# Patient Record
Sex: Female | Born: 1965 | Race: White | Hispanic: No | Marital: Married | Smoking: Former smoker
Health system: Southern US, Community
[De-identification: ages and names within clinical notes are randomized; demographics above are authoritative.]

## PROBLEM LIST (undated history)

## (undated) DIAGNOSIS — N12 Tubulo-interstitial nephritis, not specified as acute or chronic: Secondary | ICD-10-CM

## (undated) DIAGNOSIS — N809 Endometriosis, unspecified: Secondary | ICD-10-CM

## (undated) DIAGNOSIS — IMO0002 Reserved for concepts with insufficient information to code with codable children: Secondary | ICD-10-CM

## (undated) DIAGNOSIS — R87619 Unspecified abnormal cytological findings in specimens from cervix uteri: Secondary | ICD-10-CM

## (undated) DIAGNOSIS — N83209 Unspecified ovarian cyst, unspecified side: Secondary | ICD-10-CM

## (undated) DIAGNOSIS — E041 Nontoxic single thyroid nodule: Secondary | ICD-10-CM

## (undated) DIAGNOSIS — R635 Abnormal weight gain: Secondary | ICD-10-CM

## (undated) DIAGNOSIS — Z87898 Personal history of other specified conditions: Secondary | ICD-10-CM

## (undated) HISTORY — DX: Tubulo-interstitial nephritis, not specified as acute or chronic: N12

## (undated) HISTORY — DX: Unspecified ovarian cyst, unspecified side: N83.209

## (undated) HISTORY — DX: Unspecified abnormal cytological findings in specimens from cervix uteri: R87.619

## (undated) HISTORY — DX: Personal history of other specified conditions: Z87.898

## (undated) HISTORY — DX: Nontoxic single thyroid nodule: E04.1

## (undated) HISTORY — DX: Endometriosis, unspecified: N80.9

## (undated) HISTORY — PX: WISDOM TOOTH EXTRACTION: SHX21

## (undated) HISTORY — DX: Reserved for concepts with insufficient information to code with codable children: IMO0002

## (undated) HISTORY — DX: Abnormal weight gain: R63.5

---

## 2012-10-20 ENCOUNTER — Encounter: Payer: Self-pay | Admitting: Emergency Medicine

## 2012-10-20 ENCOUNTER — Emergency Department (INDEPENDENT_AMBULATORY_CARE_PROVIDER_SITE_OTHER)
Admission: EM | Admit: 2012-10-20 | Discharge: 2012-10-20 | Disposition: A | Discharge status: Home / Self Care | Payer: 59 | Source: Home / Self Care | Attending: Family Medicine | Admitting: Family Medicine

## 2012-10-20 DIAGNOSIS — J0100 Acute maxillary sinusitis, unspecified: Secondary | ICD-10-CM

## 2012-10-20 MED ORDER — AMOXICILLIN 875 MG PO TABS: 875.0000 mg | ORAL_TABLET | Freq: Two times a day (BID) | ORAL | Status: DC

## 2012-10-20 NOTE — ED Provider Notes (Signed)
History     CSN: [id number]  Arrival date & time 10/20/12  1225   First MD Initiated Contact with Patient 10/20/12 1349      Chief Complaint  Patient presents with  . Facial Pain      HPI Comments: Patient complains of approximately 2 week history of gradually progressive URI symptoms beginning with a mild sore throat (now improved), followed by progressive nasal congestion.  A cough started next but is now improved.  Complains of fatigue and initial myalgias. There has been no pleuritic pain, shortness of breath, or wheezes.  Over the past four days she has developed pain behind the bridge of her nose and eyes, and has had nausea without vomiting.  She developed sweats last night.  The history is provided by the patient.    History reviewed. No pertinent past medical history.  History reviewed. No pertinent past surgical history.  Family History  Problem Relation Age of Onset  . Cancer Mother   . Hypertension Mother     History  Substance Use Topics  . Smoking status: Former Smoker    Quit date: 10/20/1992  . Smokeless tobacco: Not on file  . Alcohol Use: Yes    OB History    Grav Para Term Preterm Abortions TAB SAB Ect Mult Living                  Review of Systems + sore throat, resolved + cough, improved No pleuritic pain No wheezing + nasal congestion + post-nasal drainage + sinus pain/pressure No itchy/red eyes No earache                                                                                                                                                            No hemoptysis No SOB No fever, + chills/sweats last night + nausea No vomiting No abdominal pain No diarrhea No urinary symptoms No skin rashes + fatigue No myalgias + headache   Allergies  Review of patient's allergies indicates no known allergies.  Home Medications   Current Outpatient Rx  Name  Route  Sig  Dispense  Refill  . AMOXICILLIN 875 MG PO TABS   Oral  Take 1 tablet (875 mg total) by mouth 2 (two) times daily.   20 tablet   0     BP 99/65  Pulse 70  Temp 98.2 F (36.8 C) (Oral)  Resp 12  Ht 5\' 7"  (1.702 m)  Wt 152 lb (68.947 kg)  BMI 23.81 kg/m2  SpO2 97%  LMP 10/03/2012  Physical Exam Nursing notes and Vital Signs reviewed. Appearance:  Patient appears uncomfortable but otherwise healthy, stated age, and in no acute distress Eyes:  Pupils are equal, round, and reactive to light and accomodation.  Extraocular movement is intact.  Conjunctivae  are not inflamed  Ears:  Canals normal.  Tympanic membranes normal.  Nose:  Mildly congested turbinates.  Maxillary sinus tenderness is present.  Pharynx:  Normal Neck:  Supple.   Tender shotty posterior nodes are palpated bilaterally  Lungs:  Clear to auscultation.  Breath sounds are equal.  Heart:  Regular rate and rhythm without murmurs, rubs, or gallops.  Abdomen:  Nontender without masses or hepatosplenomegaly.  Bowel sounds are present.  No CVA or flank tenderness.  Extremities:  No edema.  No calf tenderness Skin:  No rash present.   ED Course  Procedures  none      1. Acute maxillary sinusitis       MDM  Begin amoxicillin. Take Mucinex D (guaifenesin with decongestant) twice daily for congestion.  Increase fluid intake, rest. May use Afrin nasal spray (or generic oxymetazoline) twice daily for about 5 days.  Also recommend using saline nasal spray several times daily and saline nasal irrigation (AYR is a common brand) Stop all antihistamines for now, and other non-prescription cough/cold preparations. Follow-up with family doctor if not improving 7 to 10 days.         Lattie Haw, MD 10/20/12 [phone]

## 2012-10-20 NOTE — ED Notes (Signed)
Sinus pain and pressure x 4 days, Nausea and dizziness x 1 day

## 2012-10-22 ENCOUNTER — Telehealth: Payer: Self-pay

## 2012-10-22 NOTE — ED Notes (Signed)
Left a message on voice mail asking how patient is feeling and advising to call back with any questions or concerns.  

## 2013-06-19 ENCOUNTER — Encounter: Payer: Self-pay | Admitting: Obstetrics & Gynecology

## 2013-06-19 ENCOUNTER — Ambulatory Visit (INDEPENDENT_AMBULATORY_CARE_PROVIDER_SITE_OTHER): Payer: 59 | Admitting: Obstetrics & Gynecology

## 2013-06-19 VITALS — BP 104/67 | HR 79 | Resp 16 | Ht 67.0 in | Wt 158.0 lb

## 2013-06-19 DIAGNOSIS — Z124 Encounter for screening for malignant neoplasm of cervix: Secondary | ICD-10-CM

## 2013-06-19 DIAGNOSIS — Z1151 Encounter for screening for human papillomavirus (HPV): Secondary | ICD-10-CM

## 2013-06-19 DIAGNOSIS — Z0000 Encounter for general adult medical examination without abnormal findings: Secondary | ICD-10-CM

## 2013-06-19 DIAGNOSIS — Z113 Encounter for screening for infections with a predominantly sexual mode of transmission: Secondary | ICD-10-CM

## 2013-06-19 DIAGNOSIS — Z01419 Encounter for gynecological examination (general) (routine) without abnormal findings: Secondary | ICD-10-CM

## 2013-06-19 NOTE — Progress Notes (Signed)
Subjective:    Victoria Hamilton is a 47 y.o. female who presents for an annual exam. She recently moved from [place] and is here today for an annual exam. She reports that for the last period she had red and then brown blood for 2 weeks before her regular period which lasted another 5 or 6 days of red bleeding. This is unusual for her. The patient is sexually active. GYN screening history: last pap: was normal. The patient wears seatbelts: yes. The patient participates in regular exercise: yes. (cardio) Has the patient ever been transfused or tattooed?: no. The patient reports that there is not domestic violence in her life.   Menstrual History: OB History   Grav Para Term Preterm Abortions TAB SAB Ect Mult Living   4 1   3  3   1       Menarche age: 66 [place]: 79 Patient's last menstrual period was 06/08/2013.    The following portions of the patient's history were reviewed and updated as appropriate: allergies, current medications, past family history, past medical history, past social history, past surgical history and problem list.  Review of Systems A comprehensive review of systems was negative. 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 She uses the rhythm method for contraception.   Objective:    BP 104/67  Pulse 79  Resp 16  Ht 5\' 7"  (1.702 m)  Wt 158 lb (71.668 kg)  BMI 24.74 kg/m2  LMP 06/08/2013  General Appearance:    Alert, cooperative, no distress, appears stated age  Head:    Normocephalic, without obvious abnormality, atraumatic  Eyes:    PERRL, conjunctiva/corneas clear, EOM's intact, fundi    benign, both eyes  Ears:    Normal TM's and external ear canals, both ears  Nose:   Nares normal, septum midline, mucosa normal, no drainage    or sinus tenderness  Throat:   Lips, mucosa, and tongue normal; teeth and gums normal   Neck:   Supple, symmetrical, trachea midline, no adenopathy;    thyroid:  no enlargement/tenderness/nodules; no carotid   bruit or JVD  Back:     Symmetric, no curvature, ROM normal, no CVA tenderness  Lungs:     Clear to auscultation bilaterally, respirations unlabored  Chest Wall:    No tenderness or deformity   Heart:    Regular rate and rhythm, S1 and S2 normal, no murmur, rub   or gallop  Breast Exam:    No tenderness, masses, or nipple abnormality  Abdomen:     Soft, non-tender, bowel sounds active all four quadrants,    no masses, no organomegaly  Genitalia:    Normal female without lesion, discharge or tenderness, NSSA, NT, mobile, normal adnexal exam     Extremities:   Extremities normal, atraumatic, no cyanosis or edema  Pulses:   2+ and symmetric all extremities  Skin:   Skin color, texture, turgor normal, no rashes or lesions  Lymph nodes:   Cervical, supraclavicular, and axillary nodes normal  Neurologic:   CNII-XII intact, normal strength, sensation and reflexes    throughout  .    Assessment:    Healthy female exam.    Plan:     Breast self exam technique reviewed and patient encouraged to perform self-exam monthly. Thin prep Pap smear. with cotesting and cultures (for eval of DUB) Check fasting labs in  the near future

## 2013-06-20 ENCOUNTER — Ambulatory Visit (HOSPITAL_BASED_OUTPATIENT_CLINIC_OR_DEPARTMENT_OTHER)
Admission: RE | Admit: 2013-06-20 | Discharge: 2013-06-20 | Disposition: A | Discharge status: Home / Self Care | Payer: 59 | Source: Ambulatory Visit | Attending: Obstetrics & Gynecology | Admitting: Obstetrics & Gynecology

## 2013-06-20 DIAGNOSIS — Z1231 Encounter for screening mammogram for malignant neoplasm of breast: Secondary | ICD-10-CM | POA: Insufficient documentation

## 2013-06-20 DIAGNOSIS — Z0000 Encounter for general adult medical examination without abnormal findings: Secondary | ICD-10-CM

## 2013-06-21 ENCOUNTER — Other Ambulatory Visit (INDEPENDENT_AMBULATORY_CARE_PROVIDER_SITE_OTHER): Payer: 59

## 2013-06-21 DIAGNOSIS — Z01419 Encounter for gynecological examination (general) (routine) without abnormal findings: Secondary | ICD-10-CM

## 2013-06-21 NOTE — Patient Instructions (Signed)
Thank you for enrolling in MyChart. Please follow the instructions below to securely access your online medical record. MyChart allows you to send messages to your doctor, view your test results, renew your prescriptions, schedule appointments, and more.  How Do I Sign Up? 1. In your Internet browser, go to [email or web address] 2. Click on the New  User? link in the Sign In box.  3. Enter your MyChart Access Code exactly as it appears below. You will not need to use this code after you have completed the sign-up process. If you do not sign up before the expiration date, you must request a new code. MyChart Access Code: [phone] Expires: 07/19/2013  3:50 PM  4. Enter the last four digits of your Social Security Number (xxxx) and Date of Birth (mm/dd/yyyy) as indicated and click Next. You will be taken to the next sign-up page. 5. Create a MyChart ID. This will be your MyChart login ID and cannot be changed, so think of one that is secure and easy to remember. 6. Create a MyChart password. You can change your password at any time. 7. Enter your Password Reset Question and Answer and click Next. This can be used at a later time if you forget your password.  8. Select your communication preference, and if applicable enter your e-mail address. You will receive e-mail notification when new information is available in MyChart by choosing to receive e-mail notifications and filling in your e-mail. 9. Click Sign In. You can now view your medical record.   Additional Information If you have questions, you can email [email or web address] or call [phone] to talk to our MyChart staff. Remember, MyChart is NOT to be used for urgent needs. For medical emergencies, dial 911.

## 2013-06-22 LAB — CBC
HCT: 42.1 % (ref 36.0–46.0)
MCV: 100.2 fL — ABNORMAL HIGH (ref 78.0–100.0)
RDW: 12.8 % (ref 11.5–15.5)
WBC: 6.2 10*3/uL (ref 4.0–10.5)

## 2013-06-22 LAB — COMPREHENSIVE METABOLIC PANEL
AST: 18 U/L (ref 0–37)
BUN: 18 mg/dL (ref 6–23)
Calcium: 9.1 mg/dL (ref 8.4–10.5)
Chloride: 104 mEq/L (ref 96–112)
Creat: 0.78 mg/dL (ref 0.50–1.10)
Glucose, Bld: 77 mg/dL (ref 70–99)

## 2013-06-22 LAB — LIPID PANEL
Cholesterol: 196 mg/dL (ref 0–200)
HDL: 67 mg/dL (ref >39)
Total CHOL/HDL Ratio: 2.9 Ratio
Triglycerides: 47 mg/dL (ref <150)

## 2013-06-26 ENCOUNTER — Telehealth: Payer: Self-pay | Admitting: *Deleted

## 2013-06-26 NOTE — Telephone Encounter (Signed)
Copy of labs mailed to pt and referral to FM to f/u on labs.

## 2013-06-26 NOTE — Telephone Encounter (Signed)
Message copied by Granville Lewis on Tue Jun 26, 2013  9:59 AM ------      Message from: Allie Bossier      Created: Tue Jun 26, 2013  9:57 AM       Can you please get her a referral to her FP/Int Med. Her LDL is elevated with a normal cholesterol. I'm not sure if they want to treat that.      Thanks ------

## 2014-03-02 ENCOUNTER — Emergency Department (INDEPENDENT_AMBULATORY_CARE_PROVIDER_SITE_OTHER)
Admission: EM | Admit: 2014-03-02 | Discharge: 2014-03-02 | Disposition: A | Discharge status: Home / Self Care | Payer: 59 | Source: Home / Self Care | Attending: Family Medicine | Admitting: Family Medicine

## 2014-03-02 ENCOUNTER — Encounter: Payer: Self-pay | Admitting: Emergency Medicine

## 2014-03-02 DIAGNOSIS — Z23 Encounter for immunization: Secondary | ICD-10-CM

## 2014-03-02 DIAGNOSIS — S61209A Unspecified open wound of unspecified finger without damage to nail, initial encounter: Secondary | ICD-10-CM

## 2014-03-02 DIAGNOSIS — IMO0001 Reserved for inherently not codable concepts without codable children: Secondary | ICD-10-CM

## 2014-03-02 MED ORDER — TETANUS-DIPHTH-ACELL PERTUSSIS 5-2.5-18.5 LF-MCG/0.5 IM SUSP
0.5000 mL | Freq: Once | INTRAMUSCULAR | Status: AC
  Administered 2014-03-02: 0.5 mL via INTRAMUSCULAR

## 2014-03-02 NOTE — ED Notes (Signed)
Pt was cutting on a roof and another person accidentally cut her R hand Index finger with a utility knife.  Happened less than an hour ago.  Pain 4/10.  Bleeding has stopped.  Last tetanus 2002-2005

## 2014-03-02 NOTE — ED Provider Notes (Addendum)
CSN: [id number]     Arrival date & time 03/02/14  1034 History   First MD Initiated Contact with Patient 03/02/14 1107     Chief Complaint  Patient presents with  . Laceration    R hand index finger      HPI Comments: Patient was cutting on a roof and another person accidentally cut her right hand Index finger with a utility knife less than an hour ago.  Bleeding has stopped.  Last tetanus 2002-2005  Patient is a 48 y.o. female presenting with skin laceration. The history is provided by the patient.  Laceration Location:  Finger Finger laceration location:  R index finger Length (cm):  1.5 Depth:  Through dermis Bleeding: controlled   Time since incident:  1 hour Laceration mechanism:  Knife Pain details:    Quality:  Aching   Severity:  Mild   Timing:  Constant   Progression:  Unchanged Foreign body present:  No foreign bodies Relieved by:  Nothing Worsened by:  Movement Tetanus status:  Out of date   Past Medical History  Diagnosis Date  . Endometriosis   . Ovarian cyst   . Abnormal Pap smear   . H/O abnormal mammogram    Past Surgical History  Procedure Laterality Date  . Cesarean section     Family History  Problem Relation Age of Onset  . Cancer Mother   . Hypertension Mother   . Diabetes Sister   . Heart disease Brother   . Hypertension Brother    History  Substance Use Topics  . Smoking status: Former Smoker -- 0.50 packs/day for 5 years    Types: Cigarettes    Quit date: 10/20/1992  . Smokeless tobacco: Never Used  . Alcohol Use: Yes   OB History   Grav Para Term Preterm Abortions TAB SAB Ect Mult Living   4 1   3  3   1      Review of Systems  All other systems reviewed and are negative.   Allergies  Bee venom  Home Medications   Prior to Admission medications   Medication Sig Start Date End Date Taking? Authorizing Provider  EPINEPHrine (EPI-PEN) 0.3 mg/0.3 mL SOAJ injection Inject into the muscle once.    Historical Provider, MD    BP 106/72  Pulse 75  Temp(Src) 98.2 F (36.8 C) (Oral)  Ht 5\' 7"  (1.702 m)  Wt 169 lb 12 oz (76.998 kg)  BMI 26.58 kg/m2  SpO2 99%  LMP 02/09/2014 Physical Exam  Nursing note and vitals reviewed. Constitutional: She is oriented to person, place, and time. She appears well-developed and well-nourished. No distress.  HENT:  Head: Atraumatic.  Eyes: Conjunctivae are normal. Pupils are equal, round, and reactive to light.  Musculoskeletal:       Right hand: She exhibits laceration. She exhibits normal range of motion, no tenderness, no bony tenderness, normal two-point discrimination, normal capillary refill, no deformity and no swelling. Normal sensation noted. Normal strength noted.       Hands: On the palmar surface of the right second finger, middle phalanx, is a simple superficial linear laceration as noted on diagram.    Neurological: She is alert and oriented to person, place, and time.  Skin: Skin is warm and dry.    ED Course  Procedures  Laceration Repair (Dermabond) Discussed benefits and risks of procedure and verbal consent obtained. Using sterile technique, cleansed wound with Betadine followed by copious lavage with normal saline.  Wound carefully inspected for  debris and foreign bodies; none found.  Wound edges carefully approximated in normal anatomic position and closed with Dermabond.  Wound precautions explained to patient.              MDM   1. Laceration of second finger, right    Tdap administered. Keep wound clean and dry.  Leave Dermabond in place as long as possible.  Return for any signs of infection.  Follow instructions on Dermabond instruction sheet.    Lattie HawStephen A Haifa Hatton, MD 03/06/14 [phone]  Lattie HawStephen A Cherika Jessie, MD 03/06/14 [phone]

## 2014-03-02 NOTE — Discharge Instructions (Signed)
Keep wound clean and dry.  Leave Dermabond in place as long as possible.  Return for any signs of infection.  Follow instructions on Dermabond instruction sheet.

## 2014-03-29 ENCOUNTER — Encounter: Payer: Self-pay | Admitting: Emergency Medicine

## 2014-03-29 ENCOUNTER — Emergency Department (INDEPENDENT_AMBULATORY_CARE_PROVIDER_SITE_OTHER)
Admission: EM | Admit: 2014-03-29 | Discharge: 2014-03-29 | Disposition: A | Discharge status: Home / Self Care | Payer: 59 | Source: Home / Self Care | Attending: Family Medicine | Admitting: Family Medicine

## 2014-03-29 DIAGNOSIS — T63441A Toxic effect of venom of bees, accidental (unintentional), initial encounter: Secondary | ICD-10-CM

## 2014-03-29 DIAGNOSIS — T6391XA Toxic effect of contact with unspecified venomous animal, accidental (unintentional), initial encounter: Secondary | ICD-10-CM

## 2014-03-29 MED ORDER — EPINEPHRINE 0.3 MG/0.3ML IJ SOAJ: 0.3000 mg | Freq: Once | INTRAMUSCULAR | Status: DC

## 2014-03-29 MED ORDER — TRIAMCINOLONE ACETONIDE 40 MG/ML IJ SUSP
40.0000 mg | Freq: Once | INTRAMUSCULAR | Status: AC
  Administered 2014-03-29: 40 mg via INTRAMUSCULAR

## 2014-03-29 NOTE — ED Notes (Signed)
Victoria Hamilton reports being sting by a bee in her right thigh about 15 minutes ago. 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 Currently, only complaint is her throat feels mildly tight. No SOB or CO, diaphoresis or swelling.

## 2014-03-29 NOTE — ED Provider Notes (Signed)
CSN: [id number]     Arrival date & time 03/29/14  1628 History   First MD Initiated Contact with Patient 03/29/14 1648     Chief Complaint  Patient presents with  . Insect Bite    bee sting- post epi injection      HPI Comments: Patient has a history of bee sting allergy.  Forty minutes ago she was stung on her right anterior thigh, and immediately used her Epipen.  The area is pruritic and redness has increased but she denies difficulty swallowing, shortness of breath, wheezing, hives, etc.  She requests a refill for her epipen.  The history is provided by the patient and the spouse.    Past Medical History  Diagnosis Date  . Endometriosis   . Ovarian cyst   . Abnormal Pap smear   . H/O abnormal mammogram    Past Surgical History  Procedure Laterality Date  . Cesarean section     Family History  Problem Relation Age of Onset  . Cancer Mother   . Hypertension Mother   . Diabetes Sister   . Heart disease Brother   . Hypertension Brother    History  Substance Use Topics  . Smoking status: Former Smoker -- 0.50 packs/day for 5 years    Types: Cigarettes    Quit date: 10/20/1992  . Smokeless tobacco: Never Used  . Alcohol Use: Yes   OB History   Grav Para Term Preterm Abortions TAB SAB Ect Mult Living   4 1   3  3   1      Review of Systems  Constitutional: Negative for diaphoresis, activity change and fatigue.  HENT: Negative for congestion and rhinorrhea.   Eyes: Negative for redness.  Respiratory: Negative for cough, choking, chest tightness, shortness of breath, wheezing and stridor.   Cardiovascular: Negative.   Gastrointestinal: Negative.   Genitourinary: Negative.   Neurological: Negative for dizziness, syncope and headaches.    Allergies  Bee venom  Home Medications   Prior to Admission medications   Medication Sig Start Date End Date Taking? Authorizing Provider  EPINEPHrine (EPI-PEN) 0.3 mg/0.3 mL SOAJ injection Inject into the muscle once.     Historical Provider, MD   BP 132/69  Pulse 111  Resp 16  SpO2 97%  LMP 02/09/2014 Physical Exam  Nursing note and vitals reviewed. Constitutional: She is oriented to person, place, and time. She appears well-developed and well-nourished. No distress.  HENT:  Head: Normocephalic and atraumatic.  Right Ear: External ear normal.  Left Ear: External ear normal.  Nose: Nose normal.  Mouth/Throat: Oropharynx is clear and moist.  Eyes: Conjunctivae are normal. Pupils are equal, round, and reactive to light.  Neck: Neck supple.  Cardiovascular: Normal rate and normal heart sounds.   Pulmonary/Chest: Breath sounds normal. No stridor. No respiratory distress. She has no wheezes. She has no rales.  Abdominal: There is no tenderness.  Musculoskeletal:       Legs: On patient's right anterior thigh as noted on diagram is an 8cm diameter erythematous macule, slightly warm to touch.  Lymphadenopathy:    She has no cervical adenopathy.  Neurological: She is alert and oriented to person, place, and time.  Skin: Skin is warm and dry. She is not diaphoretic.    ED Course  Procedures  none      MDM   1. Bee sting reaction    Kenalog 40mg  IM.  Rx for Epipen refill. Take Benadryl 25mg , 2 caps every 6 to 8  hours until skin reaction decreases. If symptoms become significantly worse during the night or over the weekend, proceed to the local emergency room. Return if site of sting becomes increasingly red, painful, or swollen over the next 2 to 3 days.    Lattie HawStephen A Beese, MD 04/02/14 [phone]

## 2014-03-29 NOTE — Discharge Instructions (Signed)
Take Benadryl $RemoveBefor'25mg'gmmylhYIdSOh$ , 2 caps every 6 to 8 hours until skin reaction decreases. If symptoms become significantly worse during the night or over the weekend, proceed to the local emergency room.    Anaphylactic Reaction An anaphylactic reaction is a sudden, severe allergic reaction that involves the whole body. It can be life threatening. A hospital stay is often required. People with asthma, eczema, or hay fever are slightly more likely to have an anaphylactic reaction. CAUSES  An anaphylactic reaction may be caused by anything to which you are allergic. After being exposed to the allergic substance, your immune system becomes sensitized to it. When you are exposed to that allergic substance again, an allergic reaction can occur. Common causes of an anaphylactic reaction include:  Medicines.  Foods, especially peanuts, wheat, shellfish, milk, and eggs.  Insect bites or stings.  Blood products.  Chemicals, such as dyes, latex, and contrast material used for imaging tests. SYMPTOMS  When an allergic reaction occurs, the body releases histamine and other substances. These substances cause symptoms such as tightening of the airway. Symptoms often develop within seconds or minutes of exposure. Symptoms may include:  Skin rash or hives.  Itching.  Chest tightness.  Swelling of the eyes, tongue, or lips.  Trouble breathing or swallowing.  Lightheadedness or fainting.  Anxiety or confusion.  Stomach pains, vomiting, or diarrhea.  Nasal congestion.  A fast or irregular heartbeat (palpitations). DIAGNOSIS  Diagnosis is based on your history of recent exposure to allergic substances, your symptoms, and a physical exam. Your caregiver may also perform blood or urine tests to confirm the diagnosis. TREATMENT  Epinephrine medicine is the main treatment for an anaphylactic reaction. Other medicines that may be used for treatment include antihistamines, steroids, and albuterol. In severe  cases, fluids and medicine to support blood pressure may be given through an intravenous line (IV). Even if you improve after treatment, you need to be observed to make sure your condition does not get worse. This may require a stay in the hospital. [place] a medical alert bracelet or necklace stating your allergy.  You and your family must learn how to use an anaphylaxis kit or give an epinephrine injection to temporarily treat an emergency allergic reaction. Always carry your epinephrine injection or anaphylaxis kit with you. This can be lifesaving if you have a severe reaction.  Do not drive or perform tasks after treatment until the medicines used to treat your reaction have worn off, or until your caregiver says it is okay.  If you have hives or a rash:  Take medicines as directed by your caregiver.  You may use an over-the-counter antihistamine (diphenhydramine) as needed.  Apply cold compresses to the skin or take baths in cool water. Avoid hot baths or showers. SEEK MEDICAL CARE IF:   You develop symptoms of an allergic reaction to a new substance. Symptoms may start right away or minutes later.  You develop a rash, hives, or itching.  You develop new symptoms. SEEK IMMEDIATE MEDICAL CARE IF:   You have swelling of the mouth, difficulty breathing, or wheezing.  You have a tight feeling in your chest or throat.  You develop hives, swelling, or itching all over your body.  You develop severe vomiting or diarrhea.  You feel faint or pass out. This is an emergency. Use your epinephrine injection or anaphylaxis kit as you have been instructed. Call your local emergency services (911 in [place] Even if you improve after  the injection, you need to be examined at a hospital emergency department. MAKE SURE YOU:   Understand these instructions.  Will watch your condition.  Will get help right away if you are not doing well or get worse. Document Released:  11/01/2005 Document Revised: 05/02/2012 Document Reviewed: 02/02/2012 [facility] Patient Information 2014 [place], [place].

## 2014-04-04 ENCOUNTER — Ambulatory Visit (INDEPENDENT_AMBULATORY_CARE_PROVIDER_SITE_OTHER): Payer: Self-pay | Admitting: Family Medicine

## 2014-04-04 ENCOUNTER — Encounter: Payer: Self-pay | Admitting: Family Medicine

## 2014-04-04 DIAGNOSIS — Z0289 Encounter for other administrative examinations: Secondary | ICD-10-CM

## 2014-04-04 NOTE — Progress Notes (Deleted)
CC: Victoria Hamilton is a 48 y.o. female is here for No chief complaint on file.   Subjective: HPI:  Going to [place] in September 2015 requesting counseling on receiving Hep A & B, Flu, MMR, Varicella vaccines and a TB test.   Review Of Systems Outlined In HPI  Past Medical History  Diagnosis Date  . Endometriosis   . Ovarian cyst   . Abnormal Pap smear   . H/O abnormal mammogram     Past Surgical History  Procedure Laterality Date  . Cesarean section     Family History  Problem Relation Age of Onset  . Cancer Mother   . Hypertension Mother   . Diabetes Sister   . Heart disease Brother   . Hypertension Brother     History   Social History  . Marital Status: Married    Spouse Name: N/A    Number of Children: N/A  . Years of Education: N/A   Occupational History  . accountant    Social History Main Topics  . Smoking status: Former Smoker -- 0.50 packs/day for 5 years    Types: Cigarettes    Quit date: 10/20/1992  . Smokeless tobacco: Never Used  . Alcohol Use: Yes  . Drug Use: Yes  . Sexual Activity: Yes    Partners: Male   Other Topics Concern  . Not on file   Social History Narrative  . No narrative on file     Objective: There were no vitals taken for this visit.  General: Alert and Oriented, No Acute Distress HEENT: Pupils equal, round, reactive to light. Conjunctivae clear.  External ears unremarkable, canals clear with intact TMs with appropriate landmarks.  Middle ear appears open without effusion. Pink inferior turbinates.  Moist mucous membranes, pharynx without inflammation nor lesions.  Neck supple without palpable lymphadenopathy nor abnormal masses. Lungs: Clear to auscultation bilaterally, no wheezing/ronchi/rales.  Comfortable work of breathing. Good air movement. Cardiac: Regular rate and rhythm. Normal S1/S2.  No murmurs, rubs, nor gallops.   Abdomen: Normal bowel sounds, soft and non tender without palpable masses. Extremities: No  peripheral edema.  Strong peripheral pulses.  Mental Status: No depression, anxiety, nor agitation. Skin: Warm and dry.  Assessment & Plan: There are no diagnoses linked to this encounter.  Tdap received 03/02/14 Hep A: Now and 2nd vaccine in 6-12 months Hep B: Now, 2nd vaccine 1 month after first dose, 3rd vaccine at least 2 months after 2nd dose and at least 4 months after 1st dose. MMR: Now and 2nd vaccine in at least 1 month. Varicella: Now and 2nd vaccine in 4-8 weeks. Influenza: Once vaccine is available in late summer. CDC recommends Typhoid vaccine (single IM dose).  CDC states that estimated relative risk of malaria for [place] travelers is Low.    No Follow-up on file.

## 2014-04-04 NOTE — Progress Notes (Signed)
Patient did not show within 10 minutes at her scheduled visit today. The following was Progress note for future reference.  Tdap received 03/02/14 Hep A: Now and 2nd vaccine in 6-12 months Hep B: Now, 2nd vaccine 1 month after first dose, 3rd vaccine at least 2 months after 2nd dose and at least 4 months after 1st dose. MMR: Now and 2nd vaccine in at least 1 month. Varicella: Now and 2nd vaccine in 4-8 weeks. Influenza: Once vaccine is available in late summer. CDC recommends Typhoid vaccine (single IM dose).  CDC states that estimated relative risk of malaria for [place] travelers is Low.

## 2014-04-04 NOTE — Progress Notes (Signed)
Patient has been deemed a "no-show" for today's scheduled appointment.  Based on chief complaint and my chart review: -No follow-up necessary.   If necessary this was communicated to the patient via phone or mail on: 1:10 PM 04/04/2014

## 2014-05-15 ENCOUNTER — Other Ambulatory Visit: Payer: Self-pay | Admitting: Obstetrics & Gynecology

## 2014-05-15 DIAGNOSIS — Z1231 Encounter for screening mammogram for malignant neoplasm of breast: Secondary | ICD-10-CM

## 2014-06-24 ENCOUNTER — Inpatient Hospital Stay (HOSPITAL_BASED_OUTPATIENT_CLINIC_OR_DEPARTMENT_OTHER): Admission: RE | Admit: 2014-06-24 | Payer: 59 | Source: Ambulatory Visit

## 2014-06-27 ENCOUNTER — Ambulatory Visit (INDEPENDENT_AMBULATORY_CARE_PROVIDER_SITE_OTHER): Payer: 59

## 2014-06-27 DIAGNOSIS — Z1231 Encounter for screening mammogram for malignant neoplasm of breast: Secondary | ICD-10-CM

## 2014-09-16 ENCOUNTER — Encounter: Payer: Self-pay | Admitting: Family Medicine

## 2015-01-03 ENCOUNTER — Emergency Department (INDEPENDENT_AMBULATORY_CARE_PROVIDER_SITE_OTHER)
Admission: EM | Admit: 2015-01-03 | Discharge: 2015-01-03 | Disposition: A | Discharge status: Home / Self Care | Payer: 59 | Source: Home / Self Care | Attending: Family Medicine | Admitting: Family Medicine

## 2015-01-03 ENCOUNTER — Encounter: Payer: Self-pay | Admitting: *Deleted

## 2015-01-03 DIAGNOSIS — H532 Diplopia: Secondary | ICD-10-CM

## 2015-01-03 LAB — POCT CBC W AUTO DIFF (K'VILLE URGENT CARE)

## 2015-01-03 LAB — POCT URINALYSIS DIPSTICK
BILIRUBIN UA: NEGATIVE
GLUCOSE UA: NEGATIVE
KETONES UA: NEGATIVE
LEUKOCYTES UA: NEGATIVE
NITRITE UA: NEGATIVE
PH UA: 6.5 (ref 5–8)
Protein, UA: NEGATIVE
Spec Grav, UA: 1.02 (ref 1.005–1.03)
Urobilinogen, UA: 0.2 (ref 0–1)

## 2015-01-03 NOTE — ED Provider Notes (Signed)
CSN: [id number]     Arrival date & time 01/03/15  1032 History   First MD Initiated Contact with Patient 01/03/15 1038     Chief Complaint  Patient presents with  . Blurred Vision     HPI Comments: Patient reports that she had a bitemporal headache two weeks ago that lasted about three days before resolving, and was different than her usual migraine headache that she gets one to 2 times per year. Yesterday at about 3:30pm while working on her computer she suddenly noted double vision.  She then rested and fell asleep, sleeping for about 16 hours.  Today she feels fatigued, but she no longer has double vision.  She does however note changes in near and far vision in each eye.  She recalls that yesterday she felt slightly dizzy with movement.  She has noticed generalized swelling for about two weeks that lasts throughout the day.  No other neurologic symptoms.  No sore throat or URI symptoms.  Patient's last menstrual period was 12/11/2014.   The history is provided by the patient and the spouse.    Past Medical History  Diagnosis Date  . Endometriosis   . Ovarian cyst   . Abnormal Pap smear   . H/O abnormal mammogram    Past Surgical History  Procedure Laterality Date  . Cesarean section     Family History  Problem Relation Age of Onset  . Cancer Mother   . Hypertension Mother   . Diabetes Sister   . Heart disease Brother   . Hypertension Brother    History  Substance Use Topics  . Smoking status: Former Smoker -- 0.50 packs/day for 5 years    Types: Cigarettes    Quit date: 10/20/1992  . Smokeless tobacco: Never Used  . Alcohol Use: Yes   OB History    Gravida Para Term Preterm AB TAB SAB Ectopic Multiple Living   Review of Systems No sore throat No cough No pleuritic pain No wheezing Mild increased nasal congestion No post-nasal drainage No sinus pain/pressure No itchy/red eyes No earache No hemoptysis No SOB No fever/chills +  nausea/dizziness No vomiting No abdominal pain No diarrhea No urinary symptoms No skin rash + fatigue No myalgias + headache    Allergies  Bee venom  Home Medications   Prior to Admission medications   Medication Sig Start Date End Date Taking? Authorizing Provider  EPINEPHrine (EPIPEN) 0.3 mg/0.3 mL IJ SOAJ injection Inject 0.3 mLs (0.3 mg total) into the muscle once. 03/29/14   Lattie Haw, MD   BP 106/73 mmHg  Pulse 76  Temp(Src) 98 F (36.7 C) (Oral)  Resp 14  Wt 169 lb (76.658 kg)  SpO2 99%  LMP 12/11/2014 Physical Exam Nursing notes and Vital Signs reviewed. Appearance:  Patient appears stated age, and in no acute distress Eyes:  Pupils are equal, round, and reactive to light and accomodation.  Extraocular movement is intact.  Conjunctivae are not inflamed.  Fundi benign.  Slight nystagmus on left lateral gaze. Ears:  Canals normal.  Tympanic membranes normal.  Nose:  Mildly congested turbinates.  No sinus tenderness.  Pharynx:  Normal Neck:  Supple.  Tender enlarged posterior nodes are palpated bilaterally  Lungs:  Clear to auscultation.  Breath sounds are equal.  Heart:  Regular rate and rhythm without murmurs, rubs, or gallops.  Abdomen:  Nontender without masses or hepatosplenomegaly.  Bowel sounds  are present.  No CVA or flank tenderness.  Extremities:  Trace edema lower legs.  No calf tenderness Skin:  No rash present.  Neurologic:  Cranial nerves 2 through 12 are normal.  Patellar, achilles, and elbow reflexes are normal.  Cerebellar function is intact (finger-to-nose and rapid alternating hand movement).  Gait and station are normal.     ED Course  Procedures  None    Labs Reviewed  COMPLETE METABOLIC PANEL WITH GFR  POCT CBC W AUTO DIFF ([facility]):  WBC 7.8; LY 29.3; MO 5.9; GR 64.8; Hgb 13.6; Platelets 313   POCT URINALYSIS DIPSTICK:   BLO trace lysed; otherwise negative         MDM   1. Diplopia; suspect prodromal viral symptoms.    CMP pending. Rest, ensure adequate fluid intake.  Check temperature daily.  May take meclizine if dizziness becomes worse.   Followup with Family Doctor if not improved in one week.     Lattie HawStephen A Amanii Snethen, MD 01/03/15 [phone]

## 2015-01-03 NOTE — ED Notes (Signed)
Bed: KUC5 Expected date:  Expected time:  Means of arrival:  Comments: 

## 2015-01-03 NOTE — Discharge Instructions (Signed)
Rest, ensure adequate fluid intake.  Check temperature daily.  May take meclizine if dizziness becomes worse.   Followup with Family Doctor if not improved in one week.     Diplopia  Double vision (diplopia) means that you are seeing two of everything. Diplopia usually occurs with both eyes open, and may be worse when looking in one particular direction. If both eyes must be open to see double, this is called binocular diplopia. If double images are seen in just one eye, this is called monocular diplopia.  CAUSES  Binocular Diplopia  Disorder affecting the muscles that move the eyes or the nerves that control those muscles.  Tumor or other mass pushing on an eye from beside or behind the eye.  Myasthenia gravis. This is a neuromuscular illness that causes the body's muscles to tire easily. The eye muscles and eyelid muscles become weak. The eyes do not track together well.  Grave's disease. This is an overactivity of the thyroid gland. This condition causes swelling of tissues around the eyes. This produces a bulging out of the eyeball.  Blowout fracture of the bone around the eye. The muscles of the eye socket are damaged. This often happens when the eye is hit with force.  Complications after certain surgeries, such as a lens implant after cataract surgery.  Fluid-filled mass (abscess) behind or beside the eye, infection, or abnormal connection between arteries and veins. Sometimes, no cause is found. Monocular Diplopia  Problems with corrective lens or contacts.  Corneal abrasion, infection, severe injury, or bulging and irregularity of the corneal surface (keratoconus).  Irregularities of the pupil from drugs, severe injury, or other causes.  Problems involving the lens of the eye, such as opacities or cataracts.  Complications after certain surgeries, such as a lens implant after cataract surgery.  Retinal detachment or problems involving the blood vessels of the  retina. Sometimes, no cause is found. SYMPTOMS  Binocular Diplopia  When one eye is closed or covered, the double images disappear. Monocular Diplopia  When the unaffected eye is closed or covered, the double images remain. The double images disappear when the affected eye is closed or covered. DIAGNOSIS  A diagnosis is made during an eye exam. TREATMENT  Treatment depends on the cause or underlying disease.   Relief of double vision symptoms may be achieved by patching one eye or by using special glasses.  Surgery on the muscles of the eye may be needed. SEEK IMMEDIATE MEDICAL CARE IF:   You see two images of a single object you are looking at, either with both eyes open or with just one eye open. Document Released: 09/02/2004 Document Revised: 01/24/2012 Document Reviewed: 06/19/2008 [facility] Patient Information 2015 [place], [place]. This information is not intended to replace advice given to you by your health care provider. Make sure you discuss any questions you have with your health care provider.

## 2015-01-03 NOTE — ED Notes (Signed)
Victoria Hamilton c/o double vision , HA and dizziness yesterday. Today her vision is blurred. She slept for the past 16 hours. Reports she feels "swollen". H/O migraines, no HA today, no h/o DM.

## 2015-01-04 LAB — COMPLETE METABOLIC PANEL WITH GFR
ALBUMIN: 4.1 g/dL (ref 3.5–5.2)
ALK PHOS: 67 U/L (ref 39–117)
ALT: 13 U/L (ref 0–35)
AST: 14 U/L (ref 0–37)
BILIRUBIN TOTAL: 1.4 mg/dL — AB (ref 0.2–1.2)
BUN: 18 mg/dL (ref 6–23)
CO2: 28 mEq/L (ref 19–32)
CREATININE: 0.62 mg/dL (ref 0.50–1.10)
Calcium: 9.2 mg/dL (ref 8.4–10.5)
Chloride: 103 mEq/L (ref 96–112)
GFR, Est African American: >89 mL/min
GLUCOSE: 92 mg/dL (ref 70–99)
Potassium: 4.5 mEq/L (ref 3.5–5.3)
Sodium: 138 mEq/L (ref 135–145)
Total Protein: 6.5 g/dL (ref 6.0–8.3)

## 2015-01-05 ENCOUNTER — Telehealth: Payer: Self-pay | Admitting: Emergency Medicine

## 2015-01-15 ENCOUNTER — Ambulatory Visit: Payer: 59 | Admitting: Sports Medicine

## 2015-02-04 ENCOUNTER — Ambulatory Visit (INDEPENDENT_AMBULATORY_CARE_PROVIDER_SITE_OTHER): Payer: 59 | Admitting: Physician Assistant

## 2015-02-04 ENCOUNTER — Encounter: Payer: Self-pay | Admitting: Physician Assistant

## 2015-02-04 VITALS — BP 104/69 | HR 76 | Wt 172.0 lb

## 2015-02-04 DIAGNOSIS — R5383 Other fatigue: Secondary | ICD-10-CM | POA: Diagnosis not present

## 2015-02-04 DIAGNOSIS — E049 Nontoxic goiter, unspecified: Secondary | ICD-10-CM | POA: Diagnosis not present

## 2015-02-04 DIAGNOSIS — R0789 Other chest pain: Secondary | ICD-10-CM

## 2015-02-04 DIAGNOSIS — R635 Abnormal weight gain: Secondary | ICD-10-CM | POA: Diagnosis not present

## 2015-02-04 DIAGNOSIS — Z0000 Encounter for general adult medical examination without abnormal findings: Secondary | ICD-10-CM | POA: Insufficient documentation

## 2015-02-04 NOTE — Progress Notes (Addendum)
Subjective:    Patient ID: Victoria Hamilton, female    DOB: 1966-06-13, 49 y.o.   MRN: [id number]  HPI Patient is a 49 year old female who presents to the clinic to establish care.  .. Active Ambulatory Problems    Diagnosis Date Noted  . Well adult exam 02/04/2015   Resolved Ambulatory Problems    Diagnosis Date Noted  . No Resolved Ambulatory Problems   Past Medical History  Diagnosis Date  . Endometriosis   . Ovarian cyst   . Abnormal Pap smear   . H/O abnormal mammogram    .. Family History  Problem Relation Age of Onset  . Cancer Mother   . Hypertension Mother   . Diabetes Sister   . Heart disease Brother   . Hypertension Brother   . Heart attack Maternal Uncle 52  . Heart attack Paternal Grandfather 50   .Marland Kitchen. History   Social History  . Marital Status: Married    Spouse Name: N/A  . Number of Children: N/A  . Years of Education: N/A   Occupational History  . accountant    Social History Main Topics  . Smoking status: Former Smoker -- 0.50 packs/day for 5 years    Types: Cigarettes    Quit date: 10/20/1992  . Smokeless tobacco: Never Used  . Alcohol Use: Yes  . Drug Use: Yes  . Sexual Activity:    Partners: Male   Other Topics Concern  . Not on file   Social History Narrative   Patient ultimately has not been feeling well for the past couple of months. She has had very unspecific symptoms. She's had a lot of pressure in chest off and on. She is extremely fatigued. She had episode of double vision and was seen in urgent care in the last month. It did resolve the next couple of days and they thought was viral. She has some off and on shortness of breath. She has some off and on trouble swallowing. She has headaches more frequently. Her weight over the past 2 years has increased by 20 pounds. She is currently on no medications. Her last annual physical was with Dr. Marice Potterove in 2014.   Review of Systems  All other systems reviewed and are negative.       Objective:   Physical Exam  Constitutional: She is oriented to person, place, and time. She appears well-developed and well-nourished.  HENT:  Head: Normocephalic and atraumatic.  Right Ear: External ear normal.  Left Ear: External ear normal.  Nose: Nose normal.  Mouth/Throat: Oropharynx is clear and moist. No oropharyngeal exudate.  Eyes: Conjunctivae are normal. Right eye exhibits no discharge. Left eye exhibits no discharge.  Neck: Normal range of motion. Neck supple.  Digesting to be some bilateral thyroid enlargement to palpation. No tenderness.  Cardiovascular: Normal rate, regular rhythm and normal heart sounds.   Pulmonary/Chest: Effort normal and breath sounds normal. She has no wheezes.  Neurological: She is alert and oriented to person, place, and time.  Skin: Skin is dry.  Psychiatric: She has a normal mood and affect. Her behavior is normal.          Assessment & Plan:  Weight gain/fatigue/chest tightness/shortness of breath- unclear etiology of symptoms today. PHQ-2 was 0 and negative for depression. will check a fairly expansive fatigue panel with TSH, CBC, ferritin, B12, vitamin D, ANA, EBV, lymes disease. Patient admits to sleeping at night and getting a full 8 hours of sleep. She does not wake up  feeling rested. Will also get lipid panel.   EKG was done today. Normal sinus rhythm at 68. No ST elevation or depression. EKG was unremarkable.  Thyroid enlargement-ordered thyroid ultrasound for first thing in the morning.  Patient does have a annual physical with Dr. Marice Potter next week.

## 2015-02-04 NOTE — Patient Instructions (Addendum)
Tomorrow at 8:45 U/S  Fatigue Fatigue is a feeling of tiredness, lack of energy, lack of motivation, or feeling tired all the time. Having enough rest, good nutrition, and reducing stress will normally reduce fatigue. Consult your caregiver if it persists. The nature of your fatigue will help your caregiver to find out its cause. The treatment is based on the cause.  CAUSES  There are many causes for fatigue. Most of the time, fatigue can be traced to one or more of your habits or routines. Most causes fit into one or more of three general areas. They are: Lifestyle problems  Sleep disturbances.  Overwork.  Physical exertion.  Unhealthy habits.  Poor eating habits or eating disorders.  Alcohol and/or drug use .  Lack of proper nutrition (malnutrition). Psychological problems  Stress and/or anxiety problems.  Depression.  Grief.  Boredom. Medical Problems or Conditions  Anemia.  Pregnancy.  Thyroid gland problems.  Recovery from major surgery.  Continuous pain.  Emphysema or asthma that is not well controlled  Allergic conditions.  Diabetes.  Infections (such as mononucleosis).  Obesity.  Sleep disorders, such as sleep apnea.  Heart failure or other heart-related problems.  Cancer.  Kidney disease.  Liver disease.  Effects of certain medicines such as antihistamines, cough and cold remedies, prescription pain medicines, heart and blood pressure medicines, drugs used for treatment of cancer, and some antidepressants. SYMPTOMS  The symptoms of fatigue include:   Lack of energy.  Lack of drive (motivation).  Drowsiness.  Feeling of indifference to the surroundings. DIAGNOSIS  The details of how you feel help guide your caregiver in finding out what is causing the fatigue. You will be asked about your present and past health condition. It is important to review all medicines that you take, including prescription and non-prescription items. A  thorough exam will be done. You will be questioned about your feelings, habits, and normal lifestyle. Your caregiver may suggest blood tests, urine tests, or other tests to look for common medical causes of fatigue.  TREATMENT  Fatigue is treated by correcting the underlying cause. For example, if you have continuous pain or depression, treating these causes will improve how you feel. Similarly, adjusting the dose of certain medicines will help in reducing fatigue.  HOME CARE INSTRUCTIONS   Try to get the required amount of good sleep every night.  Eat a healthy and nutritious diet, and drink enough water throughout the day.  Practice ways of relaxing (including yoga or meditation).  Exercise regularly.  Make plans to change situations that cause stress. Act on those plans so that stresses decrease over time. Keep your work and personal routine reasonable.  Avoid street drugs and minimize use of alcohol.  Start taking a daily multivitamin after consulting your caregiver. SEEK MEDICAL CARE IF:   You have persistent tiredness, which cannot be accounted for.  You have fever.  You have unintentional weight loss.  You have headaches.  You have disturbed sleep throughout the night.  You are feeling sad.  You have constipation.  You have dry skin.  You have gained weight.  You are taking any new or different medicines that you suspect are causing fatigue.  You are unable to sleep at night.  You develop any unusual swelling of your legs or other parts of your body. SEEK IMMEDIATE MEDICAL CARE IF:   You are feeling confused.  Your vision is blurred.  You feel faint or pass out.  You develop severe headache.  You  develop severe abdominal, pelvic, or back pain.  You develop chest pain, shortness of breath, or an irregular or fast heartbeat.  You are unable to pass a normal amount of urine.  You develop abnormal bleeding such as bleeding from the rectum or you vomit  blood.  You have thoughts about harming yourself or committing suicide.  You are worried that you might harm someone else. MAKE SURE YOU:   Understand these instructions.  Will watch your condition.  Will get help right away if you are not doing well or get worse. Document Released: 08/29/2007 Document Revised: 01/24/2012 Document Reviewed: 03/05/2014 [facility] Patient Information 2015 [place], [place]. This information is not intended to replace advice given to you by your health care provider. Make sure you discuss any questions you have with your health care provider.

## 2015-02-05 ENCOUNTER — Other Ambulatory Visit: Payer: Self-pay | Admitting: Physician Assistant

## 2015-02-05 ENCOUNTER — Ambulatory Visit (INDEPENDENT_AMBULATORY_CARE_PROVIDER_SITE_OTHER): Payer: 59

## 2015-02-05 ENCOUNTER — Encounter: Payer: Self-pay | Admitting: Physician Assistant

## 2015-02-05 DIAGNOSIS — E010 Iodine-deficiency related diffuse (endemic) goiter: Secondary | ICD-10-CM | POA: Diagnosis not present

## 2015-02-05 DIAGNOSIS — E049 Nontoxic goiter, unspecified: Secondary | ICD-10-CM

## 2015-02-05 DIAGNOSIS — E041 Nontoxic single thyroid nodule: Secondary | ICD-10-CM

## 2015-02-05 DIAGNOSIS — R635 Abnormal weight gain: Secondary | ICD-10-CM

## 2015-02-05 DIAGNOSIS — E042 Nontoxic multinodular goiter: Secondary | ICD-10-CM

## 2015-02-05 DIAGNOSIS — R5383 Other fatigue: Secondary | ICD-10-CM

## 2015-02-05 LAB — LIPID PANEL
Cholesterol: 201 mg/dL — ABNORMAL HIGH (ref 0–200)
HDL: 64 mg/dL (ref >=46)
LDL CALC: 128 mg/dL — AB (ref 0–99)
TRIGLYCERIDES: 47 mg/dL (ref <150)
Total CHOL/HDL Ratio: 3.1 Ratio
VLDL: 9 mg/dL (ref 0–40)

## 2015-02-05 LAB — COMPLETE METABOLIC PANEL WITHOUT GFR
ALT: 16 U/L (ref 0–35)
AST: 16 U/L (ref 0–37)
Albumin: 4.2 g/dL (ref 3.5–5.2)
Alkaline Phosphatase: 58 U/L (ref 39–117)
BUN: 19 mg/dL (ref 6–23)
CO2: 26 meq/L (ref 19–32)
Calcium: 9.2 mg/dL (ref 8.4–10.5)
Chloride: 105 meq/L (ref 96–112)
Creat: 0.66 mg/dL (ref 0.50–1.10)
GFR, Est African American: >89 mL/min
GFR, Est Non African American: >89 mL/min
Glucose, Bld: 94 mg/dL (ref 70–99)
Potassium: 4.2 meq/L (ref 3.5–5.3)
Sodium: 139 meq/L (ref 135–145)
Total Bilirubin: 0.7 mg/dL (ref 0.2–1.2)
Total Protein: 6.7 g/dL (ref 6.0–8.3)

## 2015-02-05 LAB — FERRITIN: FERRITIN: 29 ng/mL (ref 10–291)

## 2015-02-05 LAB — TSH: TSH: 0.869 u[IU]/mL (ref 0.350–4.500)

## 2015-02-05 LAB — VITAMIN B12: VITAMIN B 12: 701 pg/mL (ref 211–911)

## 2015-02-06 LAB — EPSTEIN-BARR VIRUS VCA ANTIBODY PANEL
EBV EA IgG: <5 U/mL (ref <9.0)
EBV NA IGG: 475 U/mL — AB (ref <18.0)
EBV VCA IGG: 627 U/mL — AB (ref <18.0)
EBV VCA IgM: <10 U/mL (ref <36.0)

## 2015-02-06 LAB — VITAMIN D 25 HYDROXY (VIT D DEFICIENCY, FRACTURES): Vit D, 25-Hydroxy: 29 ng/mL — ABNORMAL LOW (ref 30–100)

## 2015-02-06 LAB — B. BURGDORFI ANTIBODIES BY WB
B BURGDORFERI IGG ABS (IB): NEGATIVE
B BURGDORFERI IGM ABS (IB): NEGATIVE

## 2015-02-06 LAB — ANA: ANA: NEGATIVE

## 2015-02-11 ENCOUNTER — Other Ambulatory Visit (HOSPITAL_COMMUNITY)
Admission: RE | Admit: 2015-02-11 | Discharge: 2015-02-11 | Disposition: A | Discharge status: Home / Self Care | Payer: 59 | Source: Ambulatory Visit | Attending: Interventional Radiology | Admitting: Interventional Radiology

## 2015-02-11 ENCOUNTER — Ambulatory Visit
Admission: RE | Admit: 2015-02-11 | Discharge: 2015-02-11 | Disposition: A | Discharge status: Home / Self Care | Payer: 59 | Source: Ambulatory Visit | Attending: Physician Assistant | Admitting: Physician Assistant

## 2015-02-11 DIAGNOSIS — E041 Nontoxic single thyroid nodule: Secondary | ICD-10-CM | POA: Insufficient documentation

## 2015-02-12 ENCOUNTER — Ambulatory Visit (INDEPENDENT_AMBULATORY_CARE_PROVIDER_SITE_OTHER): Payer: 59 | Admitting: Obstetrics & Gynecology

## 2015-02-12 ENCOUNTER — Encounter: Payer: Self-pay | Admitting: Obstetrics & Gynecology

## 2015-02-12 VITALS — BP 92/56 | HR 67 | Resp 16 | Ht 67.0 in | Wt 174.0 lb

## 2015-02-12 DIAGNOSIS — Z113 Encounter for screening for infections with a predominantly sexual mode of transmission: Secondary | ICD-10-CM

## 2015-02-12 DIAGNOSIS — Z124 Encounter for screening for malignant neoplasm of cervix: Secondary | ICD-10-CM | POA: Diagnosis not present

## 2015-02-12 DIAGNOSIS — R102 Pelvic and perineal pain: Secondary | ICD-10-CM | POA: Diagnosis not present

## 2015-02-12 DIAGNOSIS — Z0000 Encounter for general adult medical examination without abnormal findings: Secondary | ICD-10-CM

## 2015-02-12 DIAGNOSIS — Z1151 Encounter for screening for human papillomavirus (HPV): Secondary | ICD-10-CM | POA: Diagnosis not present

## 2015-02-12 DIAGNOSIS — Z01419 Encounter for gynecological examination (general) (routine) without abnormal findings: Secondary | ICD-10-CM

## 2015-02-12 MED ORDER — VITAMIN D (ERGOCALCIFEROL) 1.25 MG (50000 UNIT) PO CAPS: 50000.0000 [IU] | ORAL_CAPSULE | ORAL | Status: DC

## 2015-02-12 NOTE — Progress Notes (Signed)
Subjective:    Victoria Hamilton is a 49 y.o. MW 53P1 27(47 yo son) female who presents for an annual exam. The patient has no complaints today except for pain with sex and she will have a bloody discharge after sex since 2014. The patient is sexually active. GYN screening history: last pap: was normal. The patient wears seatbelts: yes. The patient participates in regular exercise: yes. Has the patient ever been transfused or tattooed?: no. The patient reports that there is not domestic violence in her life.   Menstrual History: OB History    Gravida Para Term Preterm AB TAB SAB Ectopic Multiple Living   4 1   3  3   1       Menarche age: [age]  Patient's last menstrual period was 02/07/2015.    The following portions of the patient's history were reviewed and updated as appropriate: allergies, current medications, past family history, past medical history, past social history, past surgical history and problem list.  Review of Systems A comprehensive review of systems was negative.  She has always used the rhythm method for contraception. She is a Estate manager/land agentfinance manager. She declines a flu vaccine. She has worsening chronic constipation.   Objective:    BP 92/56 mmHg  Pulse 67  Resp 16  Ht 5\' 7"  (1.702 m)  Wt 174 lb (78.926 kg)  BMI 27.25 kg/m2  LMP 02/07/2015  General Appearance:    Alert, cooperative, no distress, appears stated age  Head:    Normocephalic, without obvious abnormality, atraumatic  Eyes:    PERRL, conjunctiva/corneas clear, EOM's intact, fundi    benign, both eyes  Ears:    Normal TM's and external ear canals, both ears  Nose:   Nares normal, septum midline, mucosa normal, no drainage    or sinus tenderness  Throat:   Lips, mucosa, and tongue normal; teeth and gums normal  Neck:   Supple, symmetrical, trachea midline, no adenopathy;    thyroid:  no enlargement/tenderness/nodules; no carotid   bruit or JVD  Back:     Symmetric, no curvature, ROM normal, no CVA tenderness   Lungs:     Clear to auscultation bilaterally, respirations unlabored  Chest Wall:    No tenderness or deformity   Heart:    Regular rate and rhythm, S1 and S2 normal, no murmur, rub   or gallop  Breast Exam:    No tenderness, masses, or nipple abnormality  Abdomen:     Soft, non-tender, bowel sounds active all four quadrants,    no masses, no organomegaly  Genitalia:    Normal female without lesion, discharge or tenderness, NSSA, NT, mobile, No adnexal masses but I could feel a large amount of squishable material in her LLQ (presumably stool)     Extremities:   Extremities normal, atraumatic, no cyanosis or edema  Pulses:   2+ and symmetric all extremities  Skin:   Skin color, texture, turgor normal, no rashes or lesions  Lymph nodes:   Cervical, supraclavicular, and axillary nodes normal  Neurologic:   CNII-XII intact, normal strength, sensation and reflexes    throughout  .    Assessment:    Healthy female exam.   Low vitamin D LLQ pain Chronic constipation   Plan:     Breast self exam technique reviewed and patient encouraged to perform self-exam monthly. Chlamydia specimen. GC specimen. Thin prep Pap smear. add Vitamin D   Gyn u/s Rec Miralax

## 2015-02-13 LAB — CYTOLOGY - PAP

## 2015-02-14 ENCOUNTER — Ambulatory Visit (INDEPENDENT_AMBULATORY_CARE_PROVIDER_SITE_OTHER): Payer: 59

## 2015-02-14 DIAGNOSIS — N941 Dyspareunia: Secondary | ICD-10-CM

## 2015-02-14 DIAGNOSIS — R102 Pelvic and perineal pain: Secondary | ICD-10-CM

## 2015-02-14 DIAGNOSIS — R1032 Left lower quadrant pain: Secondary | ICD-10-CM | POA: Diagnosis not present

## 2015-02-17 ENCOUNTER — Telehealth: Payer: Self-pay | Admitting: *Deleted

## 2015-02-17 NOTE — Telephone Encounter (Signed)
Pt notified of normal Pap smear and pelvic U/S.  She states that she has been using the Miralax and feeling better.

## 2015-02-26 ENCOUNTER — Telehealth: Payer: Self-pay | Admitting: *Deleted

## 2015-02-26 NOTE — Telephone Encounter (Signed)
Patient called here in office a few weeks ago. Stated lab work results were   low iron  & low vitamin D. Has been taking vitamin -d but is feeling  Worse. Wonders if she should come back in or get referral to a Endocrinologist. Because something is just not right.

## 2015-02-26 NOTE — Telephone Encounter (Signed)
Please print off all labs and last note. We can send to endocrinology. Did you get biopsy report back? i did not have results?   We have done fairly complete work up for fatigue.   Ok for endocrinology referral for fatigue.

## 2015-02-27 ENCOUNTER — Telehealth: Payer: Self-pay | Admitting: *Deleted

## 2015-02-27 DIAGNOSIS — R5382 Chronic fatigue, unspecified: Secondary | ICD-10-CM

## 2015-02-27 NOTE — Telephone Encounter (Signed)
Referral placed.

## 2015-02-27 NOTE — Telephone Encounter (Signed)
Referral placed.  Pt notified.  [facility]

## 2015-04-10 ENCOUNTER — Encounter: Payer: Self-pay | Admitting: Family Medicine

## 2015-04-10 ENCOUNTER — Ambulatory Visit (INDEPENDENT_AMBULATORY_CARE_PROVIDER_SITE_OTHER): Payer: 59

## 2015-04-10 ENCOUNTER — Ambulatory Visit (INDEPENDENT_AMBULATORY_CARE_PROVIDER_SITE_OTHER): Payer: 59 | Admitting: Family Medicine

## 2015-04-10 VITALS — BP 154/84 | HR 85 | Wt 174.0 lb

## 2015-04-10 DIAGNOSIS — E559 Vitamin D deficiency, unspecified: Secondary | ICD-10-CM | POA: Diagnosis not present

## 2015-04-10 DIAGNOSIS — R0789 Other chest pain: Secondary | ICD-10-CM | POA: Diagnosis not present

## 2015-04-10 DIAGNOSIS — R591 Generalized enlarged lymph nodes: Secondary | ICD-10-CM

## 2015-04-10 DIAGNOSIS — H538 Other visual disturbances: Secondary | ICD-10-CM

## 2015-04-10 DIAGNOSIS — R079 Chest pain, unspecified: Secondary | ICD-10-CM

## 2015-04-10 DIAGNOSIS — R5383 Other fatigue: Secondary | ICD-10-CM

## 2015-04-10 DIAGNOSIS — S46812A Strain of other muscles, fascia and tendons at shoulder and upper arm level, left arm, initial encounter: Secondary | ICD-10-CM

## 2015-04-10 NOTE — Progress Notes (Signed)
   Subjective:    Patient ID: Victoria Hamilton, female    DOB: 03/21/1966, 49 y.o.   MRN: [id number]  HPI  follow-up fatigue- vitamin D was low 2 months ago. Vitamin B12 was great. Her ferritin, iron stores wer low. She was encouraged to increase iron rich foods in her diet and consider a daily supplement. She has been taking this but says she really doesn't feel any better. She screen neg for depression. Has been sleeping well.   She also had complained of some chest pressure back in March.  Still having that pressure.    She was having some pain  Around her left shoulder and chest and felt a pea sized lump that was very hard and firm.  Went away after a couple of days.  Still feels sore. Most of her pain has been on her left side. Last mammo was normal.    Pain over the Left trapezius - Has been trying to do yoga as well. Did get a massage as well that has helped.    Review of Systems  + double vision. Intermittant.      Objective:   Physical Exam  Constitutional: She is oriented to person, place, and time. She appears well-developed and well-nourished.  HENT:  Head: Normocephalic and atraumatic.  Right Ear: External ear normal.  Left Ear: External ear normal.  Nose: Nose normal.  Mouth/Throat: Oropharynx is clear and moist.  TMs and canals are clear.   Eyes: Conjunctivae and EOM are normal. Pupils are equal, round, and reactive to light.  Neck: Neck supple. No thyromegaly present.  Cardiovascular: Normal rate, regular rhythm and normal heart sounds.   Pulmonary/Chest: Effort normal and breath sounds normal. She has no wheezes. Right breast exhibits no inverted nipple, no mass, no nipple discharge, no skin change and no tenderness. Left breast exhibits no inverted nipple, no mass, no nipple discharge, no skin change and no tenderness.  Small shotty LN at the upper outer qad of the left breast towards the axilla.   Lymphadenopathy:    She has no cervical adenopathy.  Neurological: She  is alert and oriented to person, place, and time.  Skin: Skin is warm and dry.  Psychiatric: She has a normal mood and affect.          Assessment & Plan:   fatigue- unclear etiology. Good w/ u that is neg so far.  Vit D def- due to recheck levels.    atypical chest pain- EKG shows normal sinus rhythm with normal axis and no acute sT-T wave changes. Will get CXR.   Left trapezius train - massage helps. Consider PT.   Lump in axilla - suspect with either a skin cyst or a LN.  Resolved on its own. Will order US to make sure not abnormality of LN in the breast tissue. mammo is UTD.   Blurry vision - recommend she schedule appt with her eye doc.

## 2015-04-10 NOTE — Patient Instructions (Signed)
Dr. Jackquline BoschAmy Harper -optometrist.

## 2015-04-13 ENCOUNTER — Encounter: Payer: Self-pay | Admitting: Family Medicine

## 2015-04-13 DIAGNOSIS — R5383 Other fatigue: Secondary | ICD-10-CM | POA: Insufficient documentation

## 2015-04-13 DIAGNOSIS — R0789 Other chest pain: Secondary | ICD-10-CM | POA: Insufficient documentation

## 2015-07-08 ENCOUNTER — Other Ambulatory Visit: Payer: Self-pay

## 2015-07-08 DIAGNOSIS — R591 Generalized enlarged lymph nodes: Secondary | ICD-10-CM

## 2015-07-15 ENCOUNTER — Other Ambulatory Visit: Payer: Self-pay | Admitting: Family Medicine

## 2015-07-15 DIAGNOSIS — R591 Generalized enlarged lymph nodes: Secondary | ICD-10-CM

## 2015-07-22 ENCOUNTER — Ambulatory Visit
Admission: RE | Admit: 2015-07-22 | Discharge: 2015-07-22 | Disposition: A | Discharge status: Home / Self Care | Payer: 59 | Source: Ambulatory Visit | Attending: Family Medicine | Admitting: Family Medicine

## 2015-07-22 DIAGNOSIS — R591 Generalized enlarged lymph nodes: Secondary | ICD-10-CM

## 2016-06-29 ENCOUNTER — Encounter: Payer: Self-pay | Admitting: Obstetrics & Gynecology

## 2016-06-29 ENCOUNTER — Ambulatory Visit (INDEPENDENT_AMBULATORY_CARE_PROVIDER_SITE_OTHER): Payer: 59 | Admitting: Obstetrics & Gynecology

## 2016-06-29 VITALS — BP 95/65 | HR 78 | Resp 16 | Ht 67.0 in | Wt 183.0 lb

## 2016-06-29 DIAGNOSIS — K5900 Constipation, unspecified: Secondary | ICD-10-CM | POA: Diagnosis not present

## 2016-06-29 DIAGNOSIS — R635 Abnormal weight gain: Secondary | ICD-10-CM

## 2016-06-29 DIAGNOSIS — Z1151 Encounter for screening for human papillomavirus (HPV): Secondary | ICD-10-CM | POA: Diagnosis not present

## 2016-06-29 DIAGNOSIS — Z124 Encounter for screening for malignant neoplasm of cervix: Secondary | ICD-10-CM

## 2016-06-29 DIAGNOSIS — K5909 Other constipation: Secondary | ICD-10-CM

## 2016-06-29 DIAGNOSIS — Z01419 Encounter for gynecological examination (general) (routine) without abnormal findings: Secondary | ICD-10-CM

## 2016-06-29 NOTE — Progress Notes (Signed)
Subjective:    Victoria Hamilton is a 50 y.o. female who presents for an annual exam. She is super frustrated about weight gain, despite a rigid diet and conscientously working out with a Psychologist, educationaltrainer. The patient is sexually active. GYN screening history: last pap: was normal. The patient wears seatbelts: yes. The patient participates in regular exercise: yes. Has the patient ever been transfused or tattooed?: no. The patient reports that there is not domestic violence in her life.   Menstrual History: OB History    Gravida Para Term Preterm AB Living   4 1     3 1    SAB TAB Ectopic Multiple Live Births   3              Menarche age: [age] No LMP recorded. Patient is not currently having periods (Reason: Perimenopausal).    The following portions of the patient's history were reviewed and updated as appropriate: allergies, current medications, past family history, past medical history, past social history, past surgical history and problem list.  Review of Systems Pertinent items are noted in HPI. Pertinent items noted in HPI and remainder of comprehensive ROS otherwise negative.   Mammogram in 9/17 Chronic constipation   Objective:    BP 95/65   Pulse 78   Resp 16   Ht 5\' 7"  (1.702 m)   Wt 183 lb (83 kg)   BMI 28.66 kg/m   General Appearance:    Alert, cooperative, no distress, appears stated age  Head:    Normocephalic, without obvious abnormality, atraumatic  Eyes:    PERRL, conjunctiva/corneas clear, EOM's intact, fundi    benign, both eyes  Ears:    Normal TM's and external ear canals, both ears  Nose:   Nares normal, septum midline, mucosa normal, no drainage    or sinus tenderness  Throat:   Lips, mucosa, and tongue normal; teeth and gums normal  Neck:   Supple, symmetrical, trachea midline, no adenopathy;    thyroid:  no enlargement/tenderness/nodules; no carotid   bruit or JVD  Back:     Symmetric, no curvature, ROM normal, no CVA tenderness  Lungs:     Clear to  auscultation bilaterally, respirations unlabored  Chest Wall:    No tenderness or deformity   Heart:    Regular rate and rhythm, S1 and S2 normal, no murmur, rub   or gallop  Breast Exam:    No tenderness, masses, or nipple abnormality  Abdomen:     Soft, non-tender, bowel sounds active all four quadrants,    no masses, no organomegaly  Genitalia:    Normal female without lesion, discharge or tenderness     Extremities:   Extremities normal, atraumatic, no cyanosis or edema  Pulses:   2+ and symmetric all extremities  Skin:   Skin color, texture, turgor normal, no rashes or lesions  Lymph nodes:   Cervical, supraclavicular, and axillary nodes normal  Neurologic:   CNII-XII intact, normal strength, sensation and reflexes    throughout   .    Assessment:    Healthy female exam.   Chronic constipation Weight gain   Plan:     Thin prep Pap smear. with cotesting Check TSH Refer to GI Refer to Bariatric

## 2016-06-30 LAB — TSH: TSH: 0.43 m[IU]/L

## 2016-07-01 ENCOUNTER — Telehealth: Payer: Self-pay

## 2016-07-01 ENCOUNTER — Other Ambulatory Visit: Payer: Self-pay | Admitting: Family Medicine

## 2016-07-01 ENCOUNTER — Other Ambulatory Visit (HOSPITAL_COMMUNITY): Payer: Self-pay | Admitting: Obstetrics & Gynecology

## 2016-07-01 DIAGNOSIS — Z1231 Encounter for screening mammogram for malignant neoplasm of breast: Secondary | ICD-10-CM

## 2016-07-01 NOTE — Telephone Encounter (Signed)
Called the pt to give her results and there was no answer and no voicemail set up

## 2016-07-02 LAB — CYTOLOGY - PAP

## 2016-07-22 ENCOUNTER — Ambulatory Visit
Admission: RE | Admit: 2016-07-22 | Discharge: 2016-07-22 | Disposition: A | Discharge status: Home / Self Care | Payer: 59 | Source: Ambulatory Visit | Attending: Obstetrics & Gynecology | Admitting: Obstetrics & Gynecology

## 2016-07-22 DIAGNOSIS — Z1231 Encounter for screening mammogram for malignant neoplasm of breast: Secondary | ICD-10-CM

## 2016-08-03 ENCOUNTER — Encounter: Payer: Self-pay | Admitting: Physician Assistant

## 2016-08-03 ENCOUNTER — Ambulatory Visit (INDEPENDENT_AMBULATORY_CARE_PROVIDER_SITE_OTHER): Payer: 59 | Admitting: Physician Assistant

## 2016-08-03 VITALS — BP 110/69 | HR 89 | Ht 67.0 in | Wt 188.0 lb

## 2016-08-03 DIAGNOSIS — R1907 Generalized intra-abdominal and pelvic swelling, mass and lump: Secondary | ICD-10-CM

## 2016-08-03 DIAGNOSIS — F39 Unspecified mood [affective] disorder: Secondary | ICD-10-CM

## 2016-08-03 DIAGNOSIS — E041 Nontoxic single thyroid nodule: Secondary | ICD-10-CM | POA: Diagnosis not present

## 2016-08-03 DIAGNOSIS — R635 Abnormal weight gain: Secondary | ICD-10-CM | POA: Diagnosis not present

## 2016-08-03 DIAGNOSIS — R5382 Chronic fatigue, unspecified: Secondary | ICD-10-CM

## 2016-08-03 DIAGNOSIS — R4586 Emotional lability: Secondary | ICD-10-CM

## 2016-08-03 DIAGNOSIS — Z780 Asymptomatic menopausal state: Secondary | ICD-10-CM

## 2016-08-03 MED ORDER — AMBULATORY NON FORMULARY MEDICATION: 0 refills | Status: DC

## 2016-08-03 MED ORDER — FUSION PLUS PO CAPS: 1.0000 | ORAL_CAPSULE | Freq: Every day | ORAL | 11 refills | Status: DC

## 2016-08-03 NOTE — Progress Notes (Signed)
   Subjective:    Patient ID: Victoria Hamilton, female    DOB: 04/14/1966, 50 y.o.   MRN: [id number]  HPI  Pt is a 50 yo female who presents to the clinic very frustrated with chronic fatigue, weight gain, and mood changes. She has seen GYN and endocrinology with no help. She was told she was in menopause due to fsh of 6667 but last LMP was 8 months ago. She was 162 in April and 188 today. She has seen nutritionist, she hired a Systems analystpersonal trainer she is not losing any weight. She swells randomly but more in the lower extremities. She has been battling this with no answers for over 1 year and now she feels defeated and depressed. She admits she has had some thoughts she would be better off dead. She has not made a plan. Husband is with her today because her mood is effecting whole family.      Review of Systems See HPI>     Objective:   Physical Exam  Constitutional: She is oriented to person, place, and time. She appears well-developed and well-nourished.  HENT:  Head: Normocephalic and atraumatic.  Neck:  Left side thyromegaly.   Cardiovascular: Normal rate, regular rhythm and normal heart sounds.   Pulmonary/Chest: Effort normal and breath sounds normal.  Abdominal: Soft. Bowel sounds are normal. She exhibits no distension and no mass. There is no tenderness. There is no rebound and no guarding.  Lymphadenopathy:    She has no cervical adenopathy.  Neurological: She is alert and oriented to person, place, and time.  Psychiatric: Her behavior is normal.  Flat affect.           Assessment & Plan:  Chronic fatigue/weight gain/generalized swelling- unclear etiology however all symptoms seemed to start when became peri-menopausal. [facility]dered [facility] testing. Cortisol, estrogen, progesterone, Thyroid panel ordered today. She has seen endocrinologist/GYN with no help. Start fusion to see if can tolerate. Consider HRT. Discussed side effects and risk of HRT. Follow up in 4 weeks.    Thyromegaly/left thyroid cyst repeat U/S of neck.   Mood changes- certainly I feel that over a year of not feeling well could be causing mood changes and perhaps the peri/post menopausal status (not been 1 year without period) could be having an extreme effect of mood. Offered SSRI therapy, pt declined.discussed urgency to call if continues to have any sucidial thoughts or makes a plan.  Will follow up in 4 weeks.   Spent 30 minutes with patient and greater than 50 percent of time spent counseling patient regarding treatment plan.

## 2016-08-06 DIAGNOSIS — R4586 Emotional lability: Secondary | ICD-10-CM | POA: Insufficient documentation

## 2016-08-06 DIAGNOSIS — E041 Nontoxic single thyroid nodule: Secondary | ICD-10-CM | POA: Insufficient documentation

## 2016-08-06 DIAGNOSIS — R5382 Chronic fatigue, unspecified: Secondary | ICD-10-CM | POA: Insufficient documentation

## 2016-08-06 DIAGNOSIS — R635 Abnormal weight gain: Secondary | ICD-10-CM | POA: Insufficient documentation

## 2016-08-06 DIAGNOSIS — Z780 Asymptomatic menopausal state: Secondary | ICD-10-CM | POA: Insufficient documentation

## 2016-08-06 DIAGNOSIS — R1907 Generalized intra-abdominal and pelvic swelling, mass and lump: Secondary | ICD-10-CM | POA: Insufficient documentation

## 2016-08-07 ENCOUNTER — Encounter: Payer: Self-pay | Admitting: Physician Assistant

## 2016-08-07 LAB — COMPLETE METABOLIC PANEL WITH GFR
ALT: 26 U/L (ref 6–29)
AST: 21 U/L (ref 10–35)
Albumin: 4.3 g/dL (ref 3.6–5.1)
Alkaline Phosphatase: 92 U/L (ref 33–115)
BILIRUBIN TOTAL: 1.1 mg/dL (ref 0.2–1.2)
BUN: 18 mg/dL (ref 7–25)
CHLORIDE: 101 mmol/L (ref 98–110)
CO2: 25 mmol/L (ref 20–31)
CREATININE: 0.78 mg/dL (ref 0.50–1.10)
Calcium: 9.4 mg/dL (ref 8.6–10.2)
GFR, Est African American: >89 mL/min (ref >=60)
GFR, Est Non African American: >89 mL/min (ref >=60)
GLUCOSE: 99 mg/dL (ref 65–99)
Potassium: 4.6 mmol/L (ref 3.5–5.3)
SODIUM: 136 mmol/L (ref 135–146)
Total Protein: 6.7 g/dL (ref 6.1–8.1)

## 2016-08-07 LAB — TSH: TSH: 0.97 mIU/L

## 2016-08-07 LAB — PROGESTERONE: Progesterone: <0.5 ng/mL

## 2016-08-07 LAB — T4, FREE: Free T4: 1.1 ng/dL (ref 0.8–1.8)

## 2016-08-07 LAB — T3 UPTAKE: T3 UPTAKE: 37 % — AB (ref 22–35)

## 2016-08-09 ENCOUNTER — Ambulatory Visit (INDEPENDENT_AMBULATORY_CARE_PROVIDER_SITE_OTHER): Payer: 59

## 2016-08-09 ENCOUNTER — Encounter: Payer: Self-pay | Admitting: Physician Assistant

## 2016-08-09 DIAGNOSIS — E042 Nontoxic multinodular goiter: Secondary | ICD-10-CM

## 2016-08-11 LAB — ESTROGENS, TOTAL: Estrogen: 48 pg/mL — ABNORMAL LOW

## 2016-08-16 LAB — PR SALIVA TEST, HORMONE LEVEL;
DHEA: 291.4
Estradiol: 1.89
Progesterone: 326.9
RATIO: 172.96
TESTOSTERONE: 79.35

## 2016-08-18 LAB — CORTISOL, URINE, 24 HOUR
CORTISOL (UR), FREE: 38.2 ug/(24.h) (ref 4.0–50.0)
RESULTS RECEIVED: 1.6 g/(24.h) (ref 0.63–2.50)

## 2016-09-15 ENCOUNTER — Telehealth: Payer: Self-pay | Admitting: *Deleted

## 2016-09-15 NOTE — Telephone Encounter (Signed)
Pt left vm this afternoon stating that she turned in her saliva test almost a month ago & hasn't gotten any results.  I didn't see an order for it in her chart.  Have you seen any results? Please advise.

## 2016-09-16 NOTE — Telephone Encounter (Signed)
I have not they would have come from [facility] in [place]. Can you call them and see if they have them?

## 2016-10-05 ENCOUNTER — Ambulatory Visit (INDEPENDENT_AMBULATORY_CARE_PROVIDER_SITE_OTHER): Payer: 59 | Admitting: Physician Assistant

## 2016-10-05 ENCOUNTER — Encounter: Payer: Self-pay | Admitting: Physician Assistant

## 2016-10-05 VITALS — BP 93/60 | HR 81

## 2016-10-05 DIAGNOSIS — Z79899 Other long term (current) drug therapy: Secondary | ICD-10-CM

## 2016-10-05 DIAGNOSIS — R7989 Other specified abnormal findings of blood chemistry: Secondary | ICD-10-CM | POA: Diagnosis not present

## 2016-10-05 DIAGNOSIS — R790 Abnormal level of blood mineral: Secondary | ICD-10-CM | POA: Diagnosis not present

## 2016-10-05 DIAGNOSIS — R5382 Chronic fatigue, unspecified: Secondary | ICD-10-CM | POA: Diagnosis not present

## 2016-10-05 DIAGNOSIS — R5381 Other malaise: Secondary | ICD-10-CM

## 2016-10-05 DIAGNOSIS — E2839 Other primary ovarian failure: Secondary | ICD-10-CM

## 2016-10-06 DIAGNOSIS — R790 Abnormal level of blood mineral: Secondary | ICD-10-CM | POA: Insufficient documentation

## 2016-10-06 DIAGNOSIS — R5382 Chronic fatigue, unspecified: Principal | ICD-10-CM

## 2016-10-06 DIAGNOSIS — E2839 Other primary ovarian failure: Secondary | ICD-10-CM | POA: Insufficient documentation

## 2016-10-06 DIAGNOSIS — R5381 Other malaise: Secondary | ICD-10-CM | POA: Insufficient documentation

## 2016-10-06 DIAGNOSIS — R7989 Other specified abnormal findings of blood chemistry: Secondary | ICD-10-CM | POA: Insufficient documentation

## 2016-10-06 NOTE — Progress Notes (Addendum)
Subjective:    Patient ID: Victoria Hamilton, female    DOB: 04-Jun-1966, 50 y.o.   MRN: [id number]  HPI Pt presents to the clinic to follow up after last visit for fatigue 2 months ago. She went for salvia testing but she never started any HRT.   .. Active Ambulatory Problems    Diagnosis Date Noted  . Well adult exam 02/04/2015  . Thyromegaly 02/05/2015  . Left thyroid nodule 02/05/2015  . Chest pressure 04/13/2015  . Other fatigue 04/13/2015  . Generalized abdominal swelling 08/06/2016  . Mood changes (HCC) 08/06/2016  . Thyroid cyst 08/06/2016  . Weight gain 08/06/2016  . Chronic fatigue 08/06/2016  . Post-menopausal 08/06/2016  . Multinodular goiter 08/09/2016  . Chronic fatigue and malaise 10/06/2016  . Low iron stores 10/06/2016  . Low serum progesterone 10/06/2016  . Decreased estrogen level 10/06/2016   Resolved Ambulatory Problems    Diagnosis Date Noted  . No Resolved Ambulatory Problems   Past Medical History:  Diagnosis Date  . Abnormal Pap smear   . Endometriosis   . H/O abnormal mammogram   . Ovarian cyst   . Thyroid nodule   . Weight gain    .Marland Kitchen. Family History  Problem Relation Age of Onset  . Cancer Mother   . Hypertension Mother   . Heart attack Maternal Uncle 52  . Heart attack Paternal Grandfather 50  . Diabetes Sister   . Heart disease Brother   . Hypertension Brother    .Marland Kitchen. Social History   Social History  . Marital status: Married    Spouse name: N/A  . Number of children: N/A  . Years of education: N/A   Occupational History  . accountant    Social History Main Topics  . Smoking status: Former Smoker    Packs/day: 0.50    Years: 5.00    Types: Cigarettes    Quit date: 10/20/1992  . Smokeless tobacco: Never Used  . Alcohol use Yes  . Drug use:   . Sexual activity: Yes    Partners: Male    Birth control/ protection: None   Other Topics Concern  . Not on file   Social History Narrative  . No narrative on file       Review of Systems  All other systems reviewed and are negative.      Objective:   Physical Exam  Constitutional: She is oriented to person, place, and time. She appears well-developed and well-nourished.  HENT:  Head: Normocephalic and atraumatic.  Cardiovascular: Normal rate, regular rhythm and normal heart sounds.   Pulmonary/Chest: Effort normal and breath sounds normal.  Neurological: She is alert and oriented to person, place, and time.  Psychiatric: She has a normal mood and affect. Her behavior is normal.          Assessment & Plan:  Marland Kitchen.Marland Kitchen.Diagnoses and all orders for this visit:  Chronic fatigue and malaise -     Iodine, Serum/Plasma -     VITAMIN D 25 Hydroxy (Vit-D Deficiency, Fractures) -     CBC with Differential/Platelet -     B12 -     Ferritin -     IBC panel  Medication management -     Iodine, Serum/Plasma -     VITAMIN D 25 Hydroxy (Vit-D Deficiency, Fractures) -     CBC with Differential/Platelet -     B12 -     Ferritin -     IBC panel  Low iron stores -  Iodine, Serum/Plasma -     VITAMIN D 25 Hydroxy (Vit-D Deficiency, Fractures) -     CBC with Differential/Platelet -     B12 -     Ferritin -     IBC panel  Low serum progesterone -     Iodine, Serum/Plasma -     VITAMIN D 25 Hydroxy (Vit-D Deficiency, Fractures) -     CBC with Differential/Platelet -     B12 -     Ferritin -     IBC panel  Decreased estrogen level -     Iodine, Serum/Plasma -     VITAMIN D 25 Hydroxy (Vit-D Deficiency, Fractures) -     CBC with Differential/Platelet -     B12 -     Ferritin -     IBC panel  discussed with patient that she was encouraged to start progesterone and naltrexone.   External labs to be scanned in: Estradiol 1.89.  Progesterone 326.90 ratio decreased.  Testosterone 79.35 DHEA 291.40  Re sent compounding pharmacy suggestion of progesterone and naltrexone. Will follow up in 1 month and see if patient has any response to these  medications.   Ordered some more labs to evaluate fatigue.   Discussed due to serum estrogen being low we could consider adding some oral estrogen.

## 2016-11-09 ENCOUNTER — Encounter: Payer: Self-pay | Admitting: Physician Assistant

## 2017-05-04 ENCOUNTER — Ambulatory Visit (INDEPENDENT_AMBULATORY_CARE_PROVIDER_SITE_OTHER): Payer: 59 | Admitting: Physician Assistant

## 2017-05-04 ENCOUNTER — Encounter: Payer: Self-pay | Admitting: Physician Assistant

## 2017-05-04 VITALS — BP 100/68 | HR 88 | Temp 97.6°F | Ht 67.0 in | Wt 180.0 lb

## 2017-05-04 DIAGNOSIS — R5383 Other fatigue: Secondary | ICD-10-CM

## 2017-05-04 DIAGNOSIS — R591 Generalized enlarged lymph nodes: Secondary | ICD-10-CM | POA: Diagnosis not present

## 2017-05-04 DIAGNOSIS — J0100 Acute maxillary sinusitis, unspecified: Secondary | ICD-10-CM

## 2017-05-04 MED ORDER — PREDNISONE 50 MG PO TABS: ORAL_TABLET | ORAL | 0 refills | Status: DC

## 2017-05-04 MED ORDER — AZITHROMYCIN 250 MG PO TABS
ORAL_TABLET | ORAL | 0 refills | Status: DC
Start: 2017-05-04 — End: 2017-07-14

## 2017-05-04 NOTE — Patient Instructions (Addendum)
[name] Tumeric 500mg  twice a day.

## 2017-05-04 NOTE — Progress Notes (Signed)
Subjective:    Patient ID: Victoria Hamilton, female    DOB: 1966/11/01, 51 y.o.   MRN: [id number]  HPI  Pt is a 51 yo female who presents to the clinic to discuss enlarged painful lyphmnodes, fatigue, edema for last 4 days.   Pt has a hx of lymphadenopathy, chronic fatgue, weight gain. She has multinodular goiter and seen by endocrinology but no need for medication or close follow up. She admits she has been doing better. She has been staying away from sugar and gluten. Her daughter came in for the weekend and she ate whatever she wanted and lots of it. She first noted lymphnode enlargement in left inguinal. She has no UTI symptoms, fever, chills. Then a lymph node in the right axilla and then right anterior cervical. Nodes have resolved in right axilla. She does admit to sinus pressure, ear pain, nasal congestion. She has not tried anything OTC.   .. Active Ambulatory Problems    Diagnosis Date Noted  . Well adult exam 02/04/2015  . Thyromegaly 02/05/2015  . Left thyroid nodule 02/05/2015  . Chest pressure 04/13/2015  . Other fatigue 04/13/2015  . Generalized abdominal swelling 08/06/2016  . Mood changes (HCC) 08/06/2016  . Thyroid cyst 08/06/2016  . Weight gain 08/06/2016  . Chronic fatigue 08/06/2016  . Post-menopausal 08/06/2016  . Multinodular goiter 08/09/2016  . Chronic fatigue and malaise 10/06/2016  . Low iron stores 10/06/2016  . Low serum progesterone 10/06/2016  . Decreased estrogen level 10/06/2016   Resolved Ambulatory Problems    Diagnosis Date Noted  . No Resolved Ambulatory Problems   Past Medical History:  Diagnosis Date  . Abnormal Pap smear   . Endometriosis   . H/O abnormal mammogram   . Ovarian cyst   . Thyroid nodule   . Weight gain        Review of Systems    see HPI.  Objective:   Physical Exam  Constitutional: She is oriented to person, place, and time. She appears well-developed and well-nourished.  HENT:  Head: Normocephalic and  atraumatic.  Right Ear: External ear normal.  Left Ear: External ear normal.  TM's clear bilaterally.  Tenderness maxillary sinuses to palpation.  Oropharynx erythematous without tonsilar swelling, uvula midline.  Nasal turbinates red and swollen.   Eyes: Conjunctivae are normal.  Neck: Normal range of motion. Neck supple.  Right sided tender swollen anterior cervical adenopathy.   Cardiovascular: Normal rate, regular rhythm and normal heart sounds.   Pulmonary/Chest: Effort normal and breath sounds normal. She has no wheezes.  Abdominal:  Left inguinal lymph node pea sized non tender.  No lymph nodes palpated in axilla.   Neurological: She is alert and oriented to person, place, and time.  Skin:  No swelling or extermities.           Assessment & Plan:  Marland Kitchen.Marland Kitchen.Diagnoses and all orders for this visit:  Lymphadenopathy -     CBC with Differential/Platelet -     predniSONE (DELTASONE) 50 MG tablet; Take one tablet for 5 days. -     Ambulatory referral to Internal Medicine  Acute non-recurrent maxillary sinusitis -     azithromycin (ZITHROMAX) 250 MG tablet; Take 2 tablets now and then one tablet for 4 days. -     predniSONE (DELTASONE) 50 MG tablet; Take one tablet for 5 days.  No energy -     Ambulatory referral to Internal Medicine   We have done work up with hormones and for fatigue.  She got best results from diet.  I suggested intergrative doctor to look at food sensitivies and see if gluten/sugar are causing these system flares.  It seems like there is some sinusitis as well. Discussed OTC treatment first with tylenol cold sinus severe and OTC flonase. If not improving in 3 days start zpak.  Continue to watch lymphnodes if not improving certainly start prednisone burst.  CBC to get before prednisone start.  Stop gluten/sugar.  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 I know intergrative health treat this. Sending for evaluation.  Consider tumeric for overall  inflammation.

## 2017-05-13 LAB — CBC WITH DIFFERENTIAL/PLATELET
BASOS ABS: 60 {cells}/uL (ref 0–200)
Basophils Relative: 1 %
EOS ABS: 60 {cells}/uL (ref 15–500)
EOS PCT: 1 %
HEMATOCRIT: 41 % (ref 35.0–45.0)
HEMOGLOBIN: 13.5 g/dL (ref 11.7–15.5)
Lymphocytes Relative: 35 %
Lymphs Abs: 2100 cells/uL (ref 850–3900)
MCH: 31.6 pg (ref 27.0–33.0)
MCHC: 32.9 g/dL (ref 32.0–36.0)
MCV: 96 fL (ref 80.0–100.0)
MONO ABS: 360 {cells}/uL (ref 200–950)
MPV: 9.5 fL (ref 7.5–12.5)
Monocytes Relative: 6 %
NEUTROS PCT: 57 %
Neutro Abs: 3420 cells/uL (ref 1500–7800)
Platelets: 363 10*3/uL (ref 140–400)
RBC: 4.27 MIL/uL (ref 3.80–5.10)
RDW: 13.4 % (ref 11.0–15.0)
WBC: 6 10*3/uL (ref 3.8–10.8)

## 2017-06-17 ENCOUNTER — Other Ambulatory Visit (HOSPITAL_COMMUNITY): Payer: Self-pay | Admitting: Obstetrics & Gynecology

## 2017-06-17 DIAGNOSIS — Z1231 Encounter for screening mammogram for malignant neoplasm of breast: Secondary | ICD-10-CM

## 2017-07-14 ENCOUNTER — Ambulatory Visit (INDEPENDENT_AMBULATORY_CARE_PROVIDER_SITE_OTHER): Payer: 59 | Admitting: Obstetrics & Gynecology

## 2017-07-14 ENCOUNTER — Encounter: Payer: Self-pay | Admitting: Obstetrics & Gynecology

## 2017-07-14 VITALS — BP 114/63 | HR 84 | Resp 16 | Ht 67.0 in | Wt 169.0 lb

## 2017-07-14 DIAGNOSIS — Z1151 Encounter for screening for human papillomavirus (HPV): Secondary | ICD-10-CM | POA: Diagnosis not present

## 2017-07-14 DIAGNOSIS — Z01419 Encounter for gynecological examination (general) (routine) without abnormal findings: Secondary | ICD-10-CM

## 2017-07-14 DIAGNOSIS — Z124 Encounter for screening for malignant neoplasm of cervix: Secondary | ICD-10-CM | POA: Diagnosis not present

## 2017-07-14 DIAGNOSIS — M546 Pain in thoracic spine: Secondary | ICD-10-CM

## 2017-07-14 MED ORDER — ESTRADIOL ACETATE 0.05 MG/24HR VA RING: VAGINAL_RING | VAGINAL | 6 refills | Status: DC

## 2017-07-14 NOTE — Progress Notes (Signed)
Subjective:    Victoria Hamilton is a 51 y.o. M P1 66(49 yo son, no grands) female who presents for an annual exam. She has several issues today. She reports long-standing back pain due to her large breasts. Even with a 20# weight loss her breasts have not shrunk. She has tried many different bras and still has this pain. She also has indentions in her shoulders from the weight in the bra. She would like a breast reduction.  The patient is sexually active. She reports dyspareunia for more than a year. She has tried lubricant, tried different positions. This different than it was 3 years ago. Married for 10 years,Not a relationship issue. Pain is with penetration and with thrusting. Husband is well endowed.  GYN screening history: last pap: was normal. The patient wears seatbelts: yes. The patient participates in regular exercise: yes. Has the patient ever been transfused or tattooed?: no. The patient reports that there is not domestic violence in her life.   Menstrual History: OB History    Gravida Para Term Preterm AB Living   4 1     3 1    SAB TAB Ectopic Multiple Live Births   3              Menarche age: [age] No LMP recorded. Patient is not currently having periods (Reason: Perimenopausal).    The following portions of the patient's history were reviewed and updated as appropriate: allergies, current medications, past family history, past medical history, past social history, past surgical history and problem list.  Review of Systems Pertinent items are noted in HPI.   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   Objective:    BP 114/63   Pulse 84   Resp 16   Ht 5\' 7"  (1.702 m)   Wt 169 lb (76.7 kg)   BMI 26.47 kg/m   General Appearance:    Alert, cooperative, no distress, appears stated age  Head:    Normocephalic, without obvious abnormality,  atraumatic  Eyes:    PERRL, conjunctiva/corneas clear, EOM's intact, fundi    benign, both eyes  Ears:    Normal TM's and external ear canals, both ears  Nose:   Nares normal, septum midline, mucosa normal, no drainage    or sinus tenderness  Throat:   Lips, mucosa, and tongue normal; teeth and gums normal  Neck:   Supple, symmetrical, trachea midline, no adenopathy;    thyroid:  no enlargement/tenderness/nodules; no carotid   bruit or JVD  Back:     Symmetric, no curvature, ROM normal, no CVA tenderness  Lungs:     Clear to auscultation bilaterally, respirations unlabored  Chest Wall:    No tenderness or deformity   Heart:    Regular rate and rhythm, S1 and S2 normal, no murmur, rub   or gallop  Breast Exam:    No tenderness, masses, or nipple abnormality  Abdomen:     Soft, non-tender, bowel sounds active all four quadrants,    no masses, no organomegaly  Genitalia:    Normal female without lesion, discharge or tenderness, significant vulvo vaginal atrophy, NSSA, NT, mobile, no adnexal masses     Extremities:   Extremities normal, atraumatic, no cyanosis or edema  Pulses:   2+ and symmetric all extremities  Skin:   Skin color, texture, turgor normal, no rashes or  lesions  Lymph nodes:   Cervical, supraclavicular, and axillary nodes normal  Neurologic:   CNII-XII intact, normal strength, sensation and reflexes    throughout  .    Assessment:    Healthy female exam.   Dyspareunia Back pain due to large breasts   Plan:     Thin prep Pap smear. with cotesting Femring prescribed, offered vagifem if the ring won't stay in place Refer to plastic surgery for possible breast reduction.

## 2017-07-20 ENCOUNTER — Telehealth: Payer: Self-pay | Admitting: *Deleted

## 2017-07-20 MED ORDER — ESTRADIOL 10 MCG VA TABS: 1.0000 | ORAL_TABLET | VAGINAL | 12 refills | Status: DC

## 2017-07-20 NOTE — Telephone Encounter (Signed)
Pt called stating that the Femring was too expensive and wanted to switch to Vagifem.  This is the option that Dr Marice Potterove had given her at her visit.

## 2017-07-21 LAB — CYTOLOGY - PAP
DIAGNOSIS: UNDETERMINED — AB
HPV: DETECTED — AB

## 2017-07-25 ENCOUNTER — Ambulatory Visit
Admission: RE | Admit: 2017-07-25 | Discharge: 2017-07-25 | Disposition: A | Discharge status: Home / Self Care | Payer: 59 | Source: Ambulatory Visit | Attending: Obstetrics & Gynecology | Admitting: Obstetrics & Gynecology

## 2017-07-25 DIAGNOSIS — Z1231 Encounter for screening mammogram for malignant neoplasm of breast: Secondary | ICD-10-CM

## 2017-08-02 ENCOUNTER — Encounter: Payer: Self-pay | Admitting: Obstetrics & Gynecology

## 2017-08-02 ENCOUNTER — Ambulatory Visit (INDEPENDENT_AMBULATORY_CARE_PROVIDER_SITE_OTHER): Payer: 59 | Admitting: Obstetrics & Gynecology

## 2017-08-02 ENCOUNTER — Other Ambulatory Visit (HOSPITAL_COMMUNITY)
Admission: RE | Admit: 2017-08-02 | Discharge: 2017-08-02 | Disposition: A | Discharge status: Home / Self Care | Payer: 59 | Source: Ambulatory Visit | Attending: Obstetrics & Gynecology | Admitting: Obstetrics & Gynecology

## 2017-08-02 VITALS — BP 105/61 | HR 90 | Resp 16 | Ht 67.0 in | Wt 169.0 lb

## 2017-08-02 DIAGNOSIS — R87610 Atypical squamous cells of undetermined significance on cytologic smear of cervix (ASC-US): Secondary | ICD-10-CM | POA: Diagnosis not present

## 2017-08-02 DIAGNOSIS — Z3202 Encounter for pregnancy test, result negative: Secondary | ICD-10-CM | POA: Diagnosis not present

## 2017-08-02 DIAGNOSIS — R87810 Cervical high risk human papillomavirus (HPV) DNA test positive: Secondary | ICD-10-CM

## 2017-08-02 LAB — POCT URINE PREGNANCY: PREG TEST UR: NEGATIVE

## 2017-08-09 ENCOUNTER — Telehealth: Payer: Self-pay | Admitting: *Deleted

## 2017-08-09 NOTE — Telephone Encounter (Signed)
-----   Message from Allie Bossier, MD sent at 08/05/2017  8:09 AM EDT ----- Pap with cotesting in a year

## 2017-08-09 NOTE — Telephone Encounter (Signed)
Pt notified that she needs to make sure that she does have a pap smear in 1 year with co-testing.

## 2018-06-30 ENCOUNTER — Encounter: Payer: Self-pay | Admitting: Emergency Medicine

## 2018-06-30 ENCOUNTER — Emergency Department
Admission: EM | Admit: 2018-06-30 | Discharge: 2018-06-30 | Disposition: A | Discharge status: Home / Self Care | Payer: BLUE CROSS/BLUE SHIELD | Source: Home / Self Care | Attending: Family Medicine | Admitting: Family Medicine

## 2018-06-30 DIAGNOSIS — K1120 Sialoadenitis, unspecified: Secondary | ICD-10-CM

## 2018-06-30 DIAGNOSIS — M26621 Arthralgia of right temporomandibular joint: Secondary | ICD-10-CM

## 2018-06-30 MED ORDER — AMOXICILLIN-POT CLAVULANATE 875-125 MG PO TABS: 1.0000 | ORAL_TABLET | Freq: Two times a day (BID) | ORAL | 0 refills | Status: DC

## 2018-06-30 NOTE — ED Triage Notes (Signed)
Pt c/o swollen glands on right side of neck x2 weeks, ear pain and pressure x1 week. Also c/o post nasal drip.

## 2018-06-30 NOTE — ED Provider Notes (Signed)
[facility]    CSN: [id number] Arrival date & time: 06/30/18  1220     History   Chief Complaint Chief Complaint  Patient presents with  . Lymphadenopathy    HPI Victoria Hamilton is a 52 y.o. female.   Patient complains of swelling in the glands on the right side of her neck for two weeks, and right ear pain and pressure for two weeks.  The swelling in her glands becomes worse when she eats.  She has had mild sinus drainage also.  She has been fatigued but denies fevers, chills, and sweats.     Past Medical History:  Diagnosis Date  . Abnormal Pap smear   . Endometriosis   . H/O abnormal mammogram   . Ovarian cyst   . Thyroid nodule   . Weight gain     Patient Active Problem List   Diagnosis Date Noted  . ASCUS with positive high risk HPV cervical 08/02/2017  . Chronic fatigue and malaise 10/06/2016  . Low iron stores 10/06/2016  . Low serum progesterone 10/06/2016  . Decreased estrogen level 10/06/2016  . Multinodular goiter 08/09/2016  . Generalized abdominal swelling 08/06/2016  . Mood changes 08/06/2016  . Thyroid cyst 08/06/2016  . Weight gain 08/06/2016  . Chronic fatigue 08/06/2016  . Post-menopausal 08/06/2016  . Chest pressure 04/13/2015  . Other fatigue 04/13/2015  . Thyromegaly 02/05/2015  . Left thyroid nodule 02/05/2015  . Well adult exam 02/04/2015    Past Surgical History:  Procedure Laterality Date  . CESAREAN SECTION      OB History    Gravida  4   Para  1   Term      Preterm      AB  3   Living  1     SAB  3   TAB      Ectopic      Multiple      Live Births               Home Medications    Prior to Admission medications   Medication Sig Start Date End Date Taking? Authorizing Provider  AMBULATORY NON FORMULARY MEDICATION Progesterone cream    [provider]  amoxicillin-clavulanate (AUGMENTIN) 875-125 MG tablet Take 1 tablet by mouth 2 (two) times daily. Take with food 06/30/18   Lattie HawBeese,  Stephen A, MD  Estradiol 10 MCG TABS vaginal tablet Place 1 tablet (10 mcg total) vaginally 2 (two) times a week. 07/21/17   Allie Bossierove, Myra C, MD  Multiple Vitamin (THERA) TABS Take 1 tablet by mouth.    [provider]    Family History Family History  Problem Relation Age of Onset  . Cancer Mother   . Hypertension Mother   . Heart attack Maternal Uncle 52  . Heart attack Paternal Grandfather 50  . Diabetes Sister   . Heart disease Brother   . Hypertension Brother     Social History Social History   Tobacco Use  . Smoking status: Former Smoker    Packs/day: 0.50    Years: 5.00    Pack years: 2.50    Types: Cigarettes    Last attempt to quit: 10/20/1992    Years since quitting: 25.7  . Smokeless tobacco: Never Used  Substance Use Topics  . Alcohol use: Yes  . Drug use: Yes     Allergies   Bee venom and Wheat bran   Review of Systems Review of Systems No sore throat; + soreness  right neck No cough No pleuritic pain No wheezing No nasal congestion No post-nasal drainage No sinus pain/pressure No itchy/red eyes ? right earache No hemoptysis No SOB No fever/chi/lls No nausea No vomiting No abdominal pain No diarrhea No urinary symptoms No skin rash + fatigue No myalgias No headache Used OTC meds without relief   Physical Exam Triage Vital Signs ED Triage Vitals  Enc Vitals Group     BP 06/30/18 1334 101/68     Pulse Rate 06/30/18 1334 64     Resp --      Temp 06/30/18 1334 97.8 F (36.6 C)     Temp Source 06/30/18 1334 Oral     SpO2 06/30/18 1334 98 %     Weight 06/30/18 1335 162 lb (73.5 kg)     Height --      Head Circumference --      Peak Flow --      Pain Score 06/30/18 1335 3     Pain Loc --      Pain Edu? --      Excl. in GC? --    No data found.  Updated Vital Signs BP 101/68 (BP Location: Right Arm)   Pulse 64   Temp 97.8 F (36.6 C) (Oral)   Wt 73.5 kg   SpO2 98%   BMI 25.37 kg/m   Visual Acuity Right Eye  Distance:   Left Eye Distance:   Bilateral Distance:    Right Eye Near:   Left Eye Near:    Bilateral Near:     Physical Exam  Constitutional: She appears well-developed and well-nourished. No distress.  HENT:  Head: Normocephalic.  Right Ear: External ear normal.  Left Ear: External ear normal.  Nose: Nose normal.  Mouth/Throat: Oropharynx is clear and moist and mucous membranes are normal.  There is distinct tenderness over the right  temporomandibular joint.  Palpation there recreates her pain.    There is tenderness to palpation over the right submandibular gland.  Eyes: Pupils are equal, round, and reactive to light. Conjunctivae are normal.  Neck: Normal range of motion. Neck supple.  Cardiovascular: Normal rate.  Pulmonary/Chest: Effort normal.  Lymphadenopathy:    She has no cervical adenopathy.  Neurological: She is alert.  Skin: Skin is warm and dry. No rash noted.  Nursing note and vitals reviewed.     UC Treatments / Results  Labs (all labs ordered are listed, but only abnormal results are displayed) Labs Reviewed - No data to display  EKG None  Radiology No results found.  Procedures Procedures (including critical care time)  Medications Ordered in UC Medications - No data to display  Initial Impression / Assessment and Plan / UC Course  I have reviewed the triage vital signs and the nursing notes.  Pertinent labs & imaging results that were available during my care of the patient were reviewed by me and considered in my medical decision making (see chart for details).    Begin Augmentin. Followup with ENT if not improving about 2 weeks.   Final Clinical Impressions(s) / UC Diagnoses   Final diagnoses:  Submandibular gland inflammation  Arthralgia of right temporomandibular joint     Discharge Instructions     Apply ice pack to right TMJ for 15 to 20 minutes, 3 to 4 times daily  Continue until pain decreases.  May take 2 Aleve tabs  twice daily with food.   Follow these instructions every few hours: ? Suck on a lemon  candy to stimulate the flow of saliva. ? Put a warm compress over the gland. ? Gently massage the gland.  Drink enough fluid to keep your urine clear or pale yellow.  Rinse your mouth with a mixture of warm water and salt every few hours. To make this mixture, add a pinch of salt to 1 cup of warm water.  Practice good oral hygiene by brushing and flossing your teeth after meals and before you go to bed.    ED Prescriptions    Medication Sig Dispense Auth. Provider   amoxicillin-clavulanate (AUGMENTIN) 875-125 MG tablet Take 1 tablet by mouth 2 (two) times daily. Take with food 14 tablet Lattie HawBeese, Stephen A, MD         Lattie HawBeese, Stephen A, MD 07/04/18 [phone]

## 2018-06-30 NOTE — Discharge Instructions (Addendum)
Apply ice pack to right TMJ for 15 to 20 minutes, 3 to 4 times daily  Continue until pain decreases.  May take 2 Aleve tabs twice daily with food.  Follow these instructions every few hours: Suck on a lemon candy to stimulate the flow of saliva. Put a warm compress over the gland. Gently massage the gland. Drink enough fluid to keep your urine clear or pale yellow. Rinse your mouth with a mixture of warm water and salt every few hours. To make this mixture, add a pinch of salt to 1 cup of warm water. Practice good oral hygiene by brushing and flossing your teeth after meals and before you go to bed.

## 2018-07-16 HISTORY — PX: SALIVARY STONE REMOVAL: SHX5213

## 2018-07-25 ENCOUNTER — Other Ambulatory Visit (HOSPITAL_COMMUNITY): Payer: Self-pay | Admitting: Obstetrics & Gynecology

## 2018-07-25 DIAGNOSIS — Z1231 Encounter for screening mammogram for malignant neoplasm of breast: Secondary | ICD-10-CM

## 2018-07-27 ENCOUNTER — Ambulatory Visit (INDEPENDENT_AMBULATORY_CARE_PROVIDER_SITE_OTHER): Payer: BLUE CROSS/BLUE SHIELD

## 2018-07-27 DIAGNOSIS — Z1231 Encounter for screening mammogram for malignant neoplasm of breast: Secondary | ICD-10-CM

## 2018-08-10 ENCOUNTER — Ambulatory Visit (INDEPENDENT_AMBULATORY_CARE_PROVIDER_SITE_OTHER): Payer: BLUE CROSS/BLUE SHIELD

## 2018-08-10 DIAGNOSIS — R300 Dysuria: Secondary | ICD-10-CM

## 2018-08-10 LAB — POCT URINALYSIS DIPSTICK
Bilirubin, UA: NEGATIVE
GLUCOSE UA: NEGATIVE
Ketones, UA: NEGATIVE
Nitrite, UA: NEGATIVE
PROTEIN UA: NEGATIVE
SPEC GRAV UA: 1.01 (ref 1.010–1.025)
Urobilinogen, UA: 0.2 E.U./dL
pH, UA: 7 (ref 5.0–8.0)

## 2018-08-10 MED ORDER — SULFAMETHOXAZOLE-TRIMETHOPRIM 800-160 MG PO TABS: 1.0000 | ORAL_TABLET | Freq: Two times a day (BID) | ORAL | 0 refills | Status: DC

## 2018-08-10 NOTE — Progress Notes (Signed)
PT c/o frequent & painful urination for 3-4 days. Dipstick showed large leukocytes and blood. Armandina Stammer, RN said to send in Bactrim DS in for patient until culture results.

## 2018-08-13 LAB — URINE CULTURE, OB REFLEX

## 2018-08-13 LAB — CULTURE, OB URINE

## 2018-08-14 ENCOUNTER — Telehealth: Payer: Self-pay

## 2018-08-14 DIAGNOSIS — N390 Urinary tract infection, site not specified: Secondary | ICD-10-CM

## 2018-08-14 MED ORDER — NITROFURANTOIN MONOHYD MACRO 100 MG PO CAPS: 100.0000 mg | ORAL_CAPSULE | Freq: Two times a day (BID) | ORAL | 0 refills | Status: DC

## 2018-08-14 NOTE — Telephone Encounter (Signed)
PT called wanting results from urine culture and different antibiotic called in because she is not feeling better after taking the Bactrim. Results were reviewed by Mariel Aloe, RN and she said to send in Macrobid. Pharmacy verified with pt and Rx sent.

## 2018-08-16 ENCOUNTER — Encounter: Payer: Self-pay | Admitting: *Deleted

## 2018-08-16 ENCOUNTER — Telehealth: Payer: Self-pay | Admitting: *Deleted

## 2018-08-16 ENCOUNTER — Emergency Department
Admission: EM | Admit: 2018-08-16 | Discharge: 2018-08-16 | Disposition: A | Discharge status: Home / Self Care | Payer: BLUE CROSS/BLUE SHIELD | Source: Home / Self Care | Attending: Emergency Medicine | Admitting: Emergency Medicine

## 2018-08-16 ENCOUNTER — Other Ambulatory Visit: Payer: Self-pay

## 2018-08-16 DIAGNOSIS — R3129 Other microscopic hematuria: Secondary | ICD-10-CM

## 2018-08-16 DIAGNOSIS — R1084 Generalized abdominal pain: Secondary | ICD-10-CM

## 2018-08-16 DIAGNOSIS — N3001 Acute cystitis with hematuria: Secondary | ICD-10-CM

## 2018-08-16 DIAGNOSIS — R822 Biliuria: Secondary | ICD-10-CM

## 2018-08-16 DIAGNOSIS — E8889 Other specified metabolic disorders: Secondary | ICD-10-CM

## 2018-08-16 LAB — POCT URINALYSIS DIP (MANUAL ENTRY)
Glucose, UA: NEGATIVE mg/dL
Nitrite, UA: NEGATIVE
PH UA: 6 (ref 5.0–8.0)
Protein Ur, POC: 100 mg/dL — AB
SPEC GRAV UA: 1.015 (ref 1.010–1.025)
UROBILINOGEN UA: 0.2 U/dL

## 2018-08-16 MED ORDER — MAGNESIUM HYDROXIDE 400 MG/5ML PO SUSP
15.00 | ORAL | Status: DC
Start: ? — End: 2018-08-16

## 2018-08-16 MED ORDER — ACETAMINOPHEN 650 MG RE SUPP
650.00 | RECTAL | Status: DC
Start: ? — End: 2018-08-16

## 2018-08-16 MED ORDER — MORPHINE SULFATE (PF) 4 MG/ML IV SOLN
4.00 | INTRAVENOUS | Status: DC
Start: ? — End: 2018-08-16

## 2018-08-16 MED ORDER — HYDRALAZINE HCL 20 MG/ML IJ SOLN
10.00 | INTRAMUSCULAR | Status: DC
Start: ? — End: 2018-08-16

## 2018-08-16 MED ORDER — GENERIC EXTERNAL MEDICATION
10.00 | Status: DC
Start: ? — End: 2018-08-16

## 2018-08-16 MED ORDER — NITROGLYCERIN 0.4 MG SL SUBL
0.40 | SUBLINGUAL_TABLET | SUBLINGUAL | Status: DC
Start: ? — End: 2018-08-16

## 2018-08-16 MED ORDER — SODIUM CHLORIDE 0.9 % IV SOLN
10.00 | INTRAVENOUS | Status: DC
Start: ? — End: 2018-08-16

## 2018-08-16 MED ORDER — HYDROCODONE-ACETAMINOPHEN 5-325 MG PO TABS
1.00 | ORAL_TABLET | ORAL | Status: DC
Start: ? — End: 2018-08-16

## 2018-08-16 MED ORDER — ENOXAPARIN SODIUM 40 MG/0.4ML ~~LOC~~ SOLN
40.00 | SUBCUTANEOUS | Status: DC
Start: 2018-08-18 — End: 2018-08-16

## 2018-08-16 MED ORDER — ALUM & MAG HYDROXIDE-SIMETH 200-200-20 MG/5ML PO SUSP
30.00 | ORAL | Status: DC
Start: ? — End: 2018-08-16

## 2018-08-16 MED ORDER — HYDROCODONE-ACETAMINOPHEN 10-325 MG PO TABS
1.00 | ORAL_TABLET | ORAL | Status: DC
Start: ? — End: 2018-08-16

## 2018-08-16 MED ORDER — ACETAMINOPHEN 325 MG PO TABS
650.00 | ORAL_TABLET | ORAL | Status: DC
Start: ? — End: 2018-08-16

## 2018-08-16 MED ORDER — MORPHINE SULFATE (PF) 2 MG/ML IV SOLN
2.00 | INTRAVENOUS | Status: DC
Start: ? — End: 2018-08-16

## 2018-08-16 MED ORDER — GENERIC EXTERNAL MEDICATION
4.00 | Status: DC
Start: ? — End: 2018-08-16

## 2018-08-16 MED ORDER — GENERIC EXTERNAL MEDICATION
650.00 | Status: DC
Start: ? — End: 2018-08-16

## 2018-08-16 MED ORDER — GENERIC EXTERNAL MEDICATION
2.00 | Status: DC
Start: 2018-08-19 — End: 2018-08-16

## 2018-08-16 MED ORDER — POLYETHYLENE GLYCOL 3350 17 G PO PACK
17.00 | PACK | ORAL | Status: DC
Start: ? — End: 2018-08-16

## 2018-08-16 NOTE — Telephone Encounter (Signed)
Spoke with patient regarding her recent UTI.  She states that she has now been on the Macrobid for 1 1/2 days and she still feels so bad.  She has been taking Tylenol and Naprosyn around the clock yet states that her temp has been as high as 103 orally.  She states that she is drinking 120 cc daily and her urine looks like tea.  She stated that she recently had a tonsil stone removed and the surgeon told her that her immune system may be compromised.  I advised since she was still not feeling any better and maybe even worse that she should come to our UC since we have no provider today.  She agrees and will come in this AM.

## 2018-08-16 NOTE — ED Provider Notes (Signed)
[name]    CSN: [id number] Arrival date & time: 08/16/18  1054     History   Chief Complaint Chief Complaint  Patient presents with  . Dysuria    HPI Victoria Hamilton is a 52 y.o. female. HPI Patient had surgery for a salivary stone3-1/2 weeks ago. She was treated with augmentin and prednisone. 10 days ago she developed painful urination.he was treated wiactrim for 3aysbut culture returned resistant to Bactrim and. She has continued to have fevers to 1035. She has diffuse bdominal pain headache and back pain. She continues to have clammy sweaty spells. Past Medical History:  Diagnosis Date  . Abnormal Pap smear   . Endometriosis   . H/O abnormal mammogram   . Ovarian cyst   . Thyroid nodule   . Weight gain     Patient Active Problem List   Diagnosis Date Noted  . ASCUS with positive high risk HPV cervical 08/02/2017  . Chronic fatigue and malaise 10/06/2016  . Low iron stores 10/06/2016  . Low serum progesterone 10/06/2016  . Decreased estrogen level 10/06/2016  . Multinodular goiter 08/09/2016  . Generalized abdominal swelling 08/06/2016  . Mood changes 08/06/2016  . Thyroid cyst 08/06/2016  . Weight gain 08/06/2016  . Chronic fatigue 08/06/2016  . Post-menopausal 08/06/2016  . Chest pressure 04/13/2015  . Other fatigue 04/13/2015  . Thyromegaly 02/05/2015  . Left thyroid nodule 02/05/2015  . Well adult exam 02/04/2015    Past Surgical History:  Procedure Laterality Date  . CESAREAN SECTION      OB History    Gravida  4   Para  1   Term      Preterm      AB  3   Living  1     SAB  3   TAB      Ectopic      Multiple      Live Births               Home Medications    Prior to Admission medications   Medication Sig Start Date End Date Taking? Authorizing Provider  AMBULATORY NON FORMULARY MEDICATION Progesterone cream    [provider]  Estradiol 10 MCG TABS vaginal tablet Place 1 tablet (10 mcg total)  vaginally 2 (two) times a week. 07/21/17   Allie Bossier, MD  Multiple Vitamin (THERA) TABS Take 1 tablet by mouth.    [provider]  nitrofurantoin, macrocrystal-monohydrate, (MACROBID) 100 MG capsule Take 1 capsule (100 mg total) by mouth 2 (two) times daily. 08/14/18   Allie Bossier, MD    Family History Family History  Problem Relation Age of Onset  . Cancer Mother   . Hypertension Mother   . Heart attack Maternal Uncle 52  . Heart attack Paternal Grandfather 50  . Diabetes Sister   . Heart disease Brother   . Hypertension Brother     Social History Social History   Tobacco Use  . Smoking status: Former Smoker    Packs/day: 0.50    Years: 5.00    Pack years: 2.50    Types: Cigarettes    Last attempt to quit: 10/20/1992    Years since quitting: 25.8  . Smokeless tobacco: Never Used  Substance Use Topics  . Alcohol use: Yes  . Drug use: Yes     Allergies   Bee venom and Wheat bran   Review of Systems Review of Systems  Constitutional: Positive for activity change, chills,  diaphoresis and fatigue.  Eyes: Negative.   Respiratory: Negative.   Cardiovascular: Negative.   Gastrointestinal: Positive for abdominal distention and abdominal pain.  Genitourinary: Positive for difficulty urinating, dysuria and hematuria.     Physical Exam Triage Vital Signs ED Triage Vitals  Enc Vitals Group     BP 08/16/18 1127 115/72     Pulse Rate 08/16/18 1127 (!) 109     Resp 08/16/18 1127 18     Temp 08/16/18 1127 98.2 F (36.8 C)     Temp Source 08/16/18 1127 Oral     SpO2 08/16/18 1127 100 %     Weight 08/16/18 1129 163 lb (73.9 kg)     Height 08/16/18 1129 5\' 7"  (1.702 m)     Head Circumference --      Peak Flow --      Pain Score 08/16/18 1129 5     Pain Loc --      Pain Edu? --      Excl. in GC? --    No data found.  Updated Vital Signs BP 115/72 (BP Location: Right Arm)   Pulse (!) 109   Temp 98.2 F (36.8 C) (Oral)   Resp 18   Ht 5\' 7"  (1.702 m)    Wt 73.9 kg   SpO2 100%   BMI 25.53 kg/m   Visual Acuity Right Eye Distance:   Left Eye Distance:   Bilateral Distance:    Right Eye Near:   Left Eye Near:    Bilateral Near:     Physical Exam  Constitutional: She is oriented to person, place, and time.  ill nontoxic  HENT:  Head: Normocephalic.  Eyes: Pupils are equal, round, and reactive to light. EOM are normal.  Neck: Normal range of motion.  Cardiovascular: Normal rate and regular rhythm.  Pulmonary/Chest: Effort normal and breath sounds normal.  Abdominal:  Abdomen is sof ttenderness upper abdomen right andeft. Also tender left lower abdomen.no masses felt. No rebound  Neurological: She is alert and oriented to person, place, and time.  Skin: Skin is warm.  Psychiatric: She has a normal mood and affect.  Ill-appearing but affect normal.     UC Treatments / Results  Labs (all labs ordered are listed, but only abnormal results are displayed) Labs Reviewed  POCT URINALYSIS DIP (MANUAL ENTRY) - Abnormal; Notable for the following components:      Result Value   Clarity, UA cloudy (*)    Bilirubin, UA small (*)    Ketones, POC UA >= (160) (*)    Blood, UA large (*)    Protein Ur, POC =100 (*)    Leukocytes, UA Trace (*)    All other components within normal limits    EKG None  Radiology No results found.  Procedures Procedures (including critical care time)  Medications Ordered in UC Medications - No data to display  Initial Impression / Assessment and Plan / UC Course  I have reviewed the triage vital signs and the nursing notes.  Pertinent labs & imaging results that were available during my care of the patient were reviewed by me and considered in my medical decision making (see chart for details). Patient presents with persistent fever chils following treatment for urinary tract inion. She has bilirubin and ketones in her urine along with hematuria. She  Needs emergent blood work and IV fluids and  further evaluation pending these results.     Final Clinical Impressions(s) / UC Diagnoses   Final diagnoses:  Bilirubinuria  Ketosis (HCC)  Generalized abdominal pain  Other microscopic hematuria  Acute cystitis with hematuria     Discharge Instructions     Please go to the emergency room at [facility]  for evaluation.    ED Prescriptions    None     Controlled Substance Prescriptions [place] Controlled Substance Registry consulted? Not Applicable   Collene Gobble, MD 08/16/18 1200

## 2018-08-16 NOTE — Discharge Instructions (Addendum)
Please go to the emergency room at [facility] for evaluation. °

## 2018-08-16 NOTE — ED Triage Notes (Signed)
Pt c/o continued urinary symptoms after taking ABT for 1 1/2 days. She now has HA, fever, back pain, fatigue and dark colored urine. She had a salivary stone removed 3 wks ago and is concerned about her continued constipation and nausea after stopping oxycodone.

## 2018-08-17 ENCOUNTER — Encounter: Payer: Self-pay | Admitting: *Deleted

## 2018-08-18 MED ORDER — DOCUSATE SODIUM 100 MG PO CAPS
100.00 | ORAL_CAPSULE | ORAL | Status: DC
Start: 2018-08-18 — End: 2018-08-18

## 2018-08-18 MED ORDER — SODIUM CHLORIDE 0.9 % IV SOLN
100.00 | INTRAVENOUS | Status: DC
Start: ? — End: 2018-08-18

## 2018-08-18 MED ORDER — KETOROLAC TROMETHAMINE 15 MG/ML IJ SOLN
30.00 | INTRAMUSCULAR | Status: DC
Start: 2018-08-18 — End: 2018-08-18

## 2018-08-18 MED ORDER — SENNA 8.6 MG PO TABS
2.00 | ORAL_TABLET | ORAL | Status: DC
Start: 2018-08-19 — End: 2018-08-18

## 2018-08-19 ENCOUNTER — Telehealth: Payer: Self-pay

## 2018-08-19 NOTE — Telephone Encounter (Signed)
Left message with contact information to call if any questions or concerns. 

## 2018-08-21 ENCOUNTER — Ambulatory Visit: Payer: BLUE CROSS/BLUE SHIELD | Admitting: Obstetrics & Gynecology

## 2018-08-24 ENCOUNTER — Other Ambulatory Visit (HOSPITAL_COMMUNITY)
Admission: RE | Admit: 2018-08-24 | Payer: BLUE CROSS/BLUE SHIELD | Source: Ambulatory Visit | Admitting: Obstetrics & Gynecology

## 2018-08-24 ENCOUNTER — Ambulatory Visit (INDEPENDENT_AMBULATORY_CARE_PROVIDER_SITE_OTHER): Payer: BLUE CROSS/BLUE SHIELD | Admitting: Obstetrics & Gynecology

## 2018-08-24 ENCOUNTER — Encounter: Payer: Self-pay | Admitting: Obstetrics & Gynecology

## 2018-08-24 VITALS — BP 103/69 | HR 84 | Resp 16 | Ht 67.0 in | Wt 167.0 lb

## 2018-08-24 DIAGNOSIS — Z01419 Encounter for gynecological examination (general) (routine) without abnormal findings: Secondary | ICD-10-CM

## 2018-08-24 DIAGNOSIS — Z113 Encounter for screening for infections with a predominantly sexual mode of transmission: Secondary | ICD-10-CM

## 2018-08-24 DIAGNOSIS — Z124 Encounter for screening for malignant neoplasm of cervix: Secondary | ICD-10-CM | POA: Diagnosis not present

## 2018-08-24 MED ORDER — ESTRADIOL 2 MG VA RING
2.0000 mg | VAGINAL_RING | VAGINAL | 12 refills | Status: DC
Start: 2018-08-24 — End: 2019-03-08

## 2018-08-24 NOTE — Progress Notes (Signed)
Subjective:    Victoria Hamilton is a 52 y.o. female who presents for an annual exam. The patient has no complaints today. The patient is sexually active. GYN screening history: last pap: was normal. The patient wears seatbelts: yes. The patient participates in regular exercise: yes. Has the patient ever been transfused or tattooed?: no. The patient reports that there is not domestic violence in her life.   Menstrual History: OB History    Gravida  4   Para  1   Term      Preterm      AB  3   Living  1     SAB  3   TAB      Ectopic      Multiple      Live Births              Menarche age: 76 No LMP recorded. (Menstrual status: Perimenopausal).    The following portions of the patient's history were reviewed and updated as appropriate: allergies, current medications, past family history, past medical history, past social history, past surgical history and problem list.  Review of Systems Pertinent items are noted in HPI.   Married 10 years, monogamous for 12 years Goes to 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, advertising for [employer]   Objective:    BP 103/69   Pulse 84   Resp 16   Ht 5\' 7"  (1.702 m)   Wt 167 lb (75.8 kg)   BMI 26.16 kg/m   General Appearance:    Alert, cooperative, no distress, appears stated age  Head:    Normocephalic, without obvious abnormality, atraumatic  Eyes:    PERRL, conjunctiva/corneas clear, EOM's intact, fundi    benign, both eyes  Ears:    Normal TM's and external ear canals, both ears  Nose:   Nares normal, septum midline, mucosa normal, no drainage    or sinus tenderness  Throat:   Lips, mucosa, and tongue normal; teeth and gums normal  Neck:   Supple, symmetrical, trachea midline, no adenopathy;    thyroid:  no enlargement/tenderness/nodules; no carotid   bruit or JVD  Back:     Symmetric, no curvature, ROM normal, no CVA tenderness  Lungs:     Clear to auscultation bilaterally,  respirations unlabored  Chest Wall:    No tenderness or deformity   Heart:    Regular rate and rhythm, S1 and S2 normal, no murmur, rub   or gallop  Breast Exam:    No tenderness, masses, or nipple abnormality  Abdomen:     Soft, non-tender, bowel sounds active all four quadrants,    no masses, no organomegaly  Genitalia:    Normal female without lesion, discharge or tenderness, normal size and shape, anteverted, mobile, non-tender, normal adnexal exam      Extremities:   Extremities normal, atraumatic, no cyanosis or edema  Pulses:   2+ and symmetric all extremities  Skin:   Skin color, texture, turgor normal, no rashes or lesions  Lymph nodes:   Cervical, supraclavicular, and axillary nodes normal  Neurologic:   CNII-XII intact, normal strength, sensation and reflexes    throughout  .    Assessment:    Healthy female exam.    Plan:     Thin prep Pap smear. with cotesting Prescribe Estring for vaginal atrophy Flu vaccine when not on abx

## 2018-08-28 LAB — CYTOLOGY - PAP
ADEQUACY: ABSENT
DIAGNOSIS: NEGATIVE
HPV: DETECTED — AB

## 2018-08-31 ENCOUNTER — Other Ambulatory Visit: Payer: Self-pay | Admitting: *Deleted

## 2018-08-31 DIAGNOSIS — N390 Urinary tract infection, site not specified: Secondary | ICD-10-CM

## 2018-08-31 NOTE — Progress Notes (Signed)
Pt brought in a urine for culture for TOC.

## 2018-09-02 LAB — CULTURE, URINE COMPREHENSIVE
MICRO NUMBER:: 91250025
SPECIMEN QUALITY:: ADEQUATE

## 2018-09-04 ENCOUNTER — Telehealth: Payer: Self-pay | Admitting: *Deleted

## 2018-09-04 MED ORDER — FLUCONAZOLE 150 MG PO TABS: ORAL_TABLET | ORAL | 0 refills | Status: DC

## 2018-09-04 NOTE — Telephone Encounter (Signed)
Pt called inquiring about her urine culture.  She now has yeast in her urine and will treat her with Diflucan per protocol.  RX sent to her pharmacy.

## 2018-09-21 ENCOUNTER — Encounter: Payer: Self-pay | Admitting: Gastroenterology

## 2018-09-21 ENCOUNTER — Encounter: Payer: Self-pay | Admitting: Physician Assistant

## 2018-10-09 ENCOUNTER — Ambulatory Visit (AMBULATORY_SURGERY_CENTER): Payer: Self-pay | Admitting: *Deleted

## 2018-10-09 VITALS — Ht 67.0 in | Wt 171.0 lb

## 2018-10-09 DIAGNOSIS — R194 Change in bowel habit: Secondary | ICD-10-CM

## 2018-10-09 NOTE — Progress Notes (Signed)
Patient denies any allergies to eggs or soy. Patient denies any problems with anesthesia/sedation. Patient denies any oxygen use at home. Patient denies taking any diet/weight loss medications or blood thinners. Made office visit for patient per her request. She is having "change in bowel habit,& severe constipation". Colonoscopy is still on the schedule for 11/06/18, pt aware. I also notified Patty,RN.

## 2018-11-03 ENCOUNTER — Ambulatory Visit: Payer: BLUE CROSS/BLUE SHIELD | Admitting: Gastroenterology

## 2018-11-03 ENCOUNTER — Encounter: Payer: Self-pay | Admitting: Gastroenterology

## 2018-11-03 ENCOUNTER — Encounter: Payer: BLUE CROSS/BLUE SHIELD | Admitting: Gastroenterology

## 2018-11-03 VITALS — BP 122/76 | HR 70 | Ht 67.0 in | Wt 168.0 lb

## 2018-11-03 DIAGNOSIS — R194 Change in bowel habit: Secondary | ICD-10-CM

## 2018-11-03 DIAGNOSIS — K5900 Constipation, unspecified: Secondary | ICD-10-CM | POA: Diagnosis not present

## 2018-11-03 MED ORDER — PEG 3350-KCL-NA BICARB-NACL 420 G PO SOLR: 4000.0000 mL | ORAL | 0 refills | Status: DC

## 2018-11-03 NOTE — Progress Notes (Signed)
HPI: This is a very pleasant 52 year old woman who was referred to me by Jomarie LongsBreeback, Jade L, PA-C  to evaluate chronic constipation.    Chief complaint is chronic constipation   Constipated most ofher life.  q 2-3 days.  Lately only once weekly.  Minor rectal bleeding.  Has been going on for about year.  miralax bloats her; stopped. And started enzyme supplements, probiotics and magnesium.  These help but can   Up 10 pounds in 6 months.  No FH of colon cancer.   miralax a few years ago for about 6 months.  She stopped it because she does not want to take laxatives long-term.  It may have caused minor bloating as well.  Fiber supplements have definitely disagreed with her in the past, made things worse..   She is already scheduled for a colonoscopy on Monday.   Review of systems: Pertinent positive and negative review of systems were noted in the above HPI section. All other review negative.   Past Medical History:  Diagnosis Date  . Abnormal Pap smear   . Endometriosis   . H/O abnormal mammogram   . Ovarian cyst   . Pyelonephritis   . Thyroid nodule   . Weight gain     Past Surgical History:  Procedure Laterality Date  . CESAREAN SECTION    . SALIVARY STONE REMOVAL  07/2018  . WISDOM TOOTH EXTRACTION      Current Outpatient Medications  Medication Sig Dispense Refill  . estradiol (ESTRING) 2 MG vaginal ring Place 2 mg vaginally every 3 (three) months. follow package directions 1 each 12  . Magnesium 250 MG TABS Take by mouth.    . Multiple Vitamin (THERA) TABS Take 1 tablet by mouth.    . Probiotic Product (PROBIOTIC DAILY PO) Take by mouth.    . thyroid (ARMOUR) 30 MG tablet Take 15 mg by mouth 2 (two) times daily.    . Vitamin D, Ergocalciferol, (DRISDOL) 50000 units CAPS capsule Take by mouth.     No current facility-administered medications for this visit.     Allergies as of 11/03/2018 - Review Complete 11/03/2018  Allergen Reaction Noted  . Bee venom   06/19/2013  . Wheat bran Diarrhea 07/14/2017  . Yeast-related products Diarrhea 10/09/2018  . Sulfa antibiotics Other (See Comments) 08/17/2018    Family History  Problem Relation Age of Onset  . Cancer Mother   . Hypertension Mother   . Colon polyps Mother   . Heart attack Maternal Uncle 52  . Heart attack Paternal Grandfather 50  . Diabetes Sister   . Heart disease Brother   . Hypertension Brother   . Colon cancer Neg Hx   . Esophageal cancer Neg Hx   . Rectal cancer Neg Hx   . Stomach cancer Neg Hx     Social History   Socioeconomic History  . Marital status: Married    Spouse name: Not on file  . Number of children: Not on file  . Years of education: Not on file  . Highest education level: Not on file  Occupational History  . Occupation: Airline pilotaccountant  Social Needs  . Financial resource strain: Not on file  . Food insecurity:    Worry: Not on file    Inability: Not on file  . Transportation needs:    Medical: Not on file    Non-medical: Not on file  Tobacco Use  . Smoking status: Former Smoker    Packs/day: 0.50    Years:  5.00    Pack years: 2.50    Types: Cigarettes    Last attempt to quit: 10/20/1992    Years since quitting: 26.0  . Smokeless tobacco: Never Used  Substance and Sexual Activity  . Alcohol use: Not Currently  . Drug use: Not Currently  . Sexual activity: Yes    Partners: Male    Birth control/protection: None  Lifestyle  . Physical activity:    Days per week: Not on file    Minutes per session: Not on file  . Stress: Not on file  Relationships  . Social connections:    Talks on phone: Not on file    Gets together: Not on file    Attends religious service: Not on file    Active member of club or organization: Not on file    Attends meetings of clubs or organizations: Not on file    Relationship status: Not on file  . Intimate partner violence:    Fear of current or ex partner: Not on file    Emotionally abused: Not on file     Physically abused: Not on file    Forced sexual activity: Not on file  Other Topics Concern  . Not on file  Social History Narrative  . Not on file     Physical Exam: BP 122/76   Pulse 70   Ht 5\' 7"  (1.702 m)   Wt 168 lb (76.2 kg)   BMI 26.31 kg/m  Constitutional: generally well-appearing Psychiatric: alert and oriented x3 Eyes: extraocular movements intact Mouth: oral pharynx moist, no lesions Neck: supple no lymphadenopathy Cardiovascular: heart regular rate and rhythm Lungs: clear to auscultation bilaterally Abdomen: soft, nontender, nondistended, no obvious ascites, no peritoneal signs, normal bowel sounds Extremities: no lower extremity edema bilaterally Skin: no lesions on visible extremities   Assessment and plan: 52 y.o. female with chronic constipation, likely functional, routine risk for colon cancer  She is at routine risk for colon cancer and has a screening examination with colonoscopy already set for Monday.  I recommended double prep protocol given her chronic constipation to make sure she is cleaned out enough to have a valuable exam.  Following the colonoscopy I will likely start her on once daily MiraLAX.  She has tried in the past but was a bit reluctant to continue it chronically thinking that it was a stimulant laxative.  I corrected her about that and that it is absolutely safe to be on long-term.  I see no reason for any further blood tests or imaging studies prior to the examination on Monday    Please see the "Patient Instructions" section for addition details about the plan.   Rob Buntinganiel Batoul Limes, MD [facility] 11/03/2018, 11:29 AM  Cc: Jomarie LongsBreeback, Jade L, PA-C

## 2018-11-03 NOTE — Patient Instructions (Addendum)
Double prep protocol for the colonoscopy on Monday.  AFter that, will start once daily miralax.  Thank you for entrusting me with your care and choosing [facility].  Dr Christella HartiganJacobs

## 2018-11-06 ENCOUNTER — Encounter: Payer: Self-pay | Admitting: Gastroenterology

## 2018-11-06 ENCOUNTER — Ambulatory Visit (AMBULATORY_SURGERY_CENTER): Payer: BLUE CROSS/BLUE SHIELD | Admitting: Gastroenterology

## 2018-11-06 VITALS — BP 118/65 | HR 59 | Temp 98.6°F | Resp 25 | Ht 67.0 in | Wt 171.0 lb

## 2018-11-06 DIAGNOSIS — Z1211 Encounter for screening for malignant neoplasm of colon: Secondary | ICD-10-CM

## 2018-11-06 MED ORDER — SODIUM CHLORIDE 0.9 % IV SOLN: 500.0000 mL | Freq: Once | INTRAVENOUS | Status: DC

## 2018-11-06 NOTE — Progress Notes (Signed)
Report given to PACU, vss 

## 2018-11-06 NOTE — Op Note (Signed)
[facility] Patient Name: Victoria Hamilton Procedure Date: 11/06/2018 1:52 PM MRN: [id number] Endoscopist: Rachael Feeaniel P Maysie Parkhill , MD Age: 52 Referring MD:  Date of Birth: 24-Oct-1966 Gender: Female Account #: [id number] Procedure:                Colonoscopy Indications:              Screening for colorectal malignant neoplasm Medicines:                Monitored Anesthesia Care Procedure:                Pre-Anesthesia Assessment:                           - Prior to the procedure, a History and Physical                            was performed, and patient medications and                            allergies were reviewed. The patient's tolerance of                            previous anesthesia was also reviewed. The risks                            and benefits of the procedure and the sedation                            options and risks were discussed with the patient.                            All questions were answered, and informed consent                            was obtained. Prior Anticoagulants: The patient has                            taken no previous anticoagulant or antiplatelet                            agents. ASA Grade Assessment: II - A patient with                            mild systemic disease. After reviewing the risks                            and benefits, the patient was deemed in                            satisfactory condition to undergo the procedure.                           After obtaining informed consent, the colonoscope  was passed under direct vision. Throughout the                            procedure, the patient's blood pressure, pulse, and                            oxygen saturations were monitored continuously. The                            Colonoscope was introduced through the anus and                            advanced to the the cecum, identified by                            appendiceal orifice and  ileocecal valve. The                            colonoscopy was performed without difficulty. The                            patient tolerated the procedure well. The quality                            of the bowel preparation was good. The ileocecal                            valve, appendiceal orifice, and rectum were                            photographed. Scope In: 1:58:51 PM Scope Out: 2:09:34 PM Scope Withdrawal Time: 0 hours 7 minutes 14 seconds  Total Procedure Duration: 0 hours 10 minutes 43 seconds  Findings:                 The entire examined colon appeared normal on direct                            and retroflexion views. Complications:            No immediate complications. Estimated blood loss:                            None. Estimated Blood Loss:     none Impression:               - The entire examined colon is normal on direct and                            retroflexion views.                           - No specimens collected. Recommendation:           - Patient has a contact number available for  emergencies. The signs and symptoms of potential                            delayed complications were discussed with the                            patient. Return to normal activities tomorrow.                            Written discharge instructions were provided to the                            patient.                           - Resume previous diet.                           - Continue present medications. Start once daily                            miralax (one dose) and call in 3-4 weeks to report                            on your response.                           - Repeat colonoscopy in 10 years for screening. Rachael Feeaniel P Orlo Brickle, MD 11/06/2018 2:11:53 PM This report has been signed electronically.

## 2018-11-06 NOTE — Patient Instructions (Signed)
Discharge instructions given. Normal exam. Resume previous medications. YOU HAD AN ENDOSCOPIC PROCEDURE TODAY AT THE [facility]:   Refer to the procedure report that was given to you for any specific questions about what was found during the examination.  If the procedure report does not answer your questions, please call your gastroenterologist to clarify.  If you requested that your care partner not be given the details of your procedure findings, then the procedure report has been included in a sealed envelope for you to review at your convenience later.  YOU SHOULD EXPECT: Some feelings of bloating in the abdomen. Passage of more gas than usual.  Walking can help get rid of the air that was put into your GI tract during the procedure and reduce the bloating. If you had a lower endoscopy (such as a colonoscopy or flexible sigmoidoscopy) you may notice spotting of blood in your stool or on the toilet paper. If you underwent a bowel prep for your procedure, you may not have a normal bowel movement for a few days.  Please Note:  You might notice some irritation and congestion in your nose or some drainage.  This is from the oxygen used during your procedure.  There is no need for concern and it should clear up in a day or so.  SYMPTOMS TO REPORT IMMEDIATELY:   Following lower endoscopy (colonoscopy or flexible sigmoidoscopy):  Excessive amounts of blood in the stool  Significant tenderness or worsening of abdominal pains  Swelling of the abdomen that is new, acute  Fever of 100F or higher   For urgent or emergent issues, a gastroenterologist can be reached at any hour by calling [phone].   DIET:  We do recommend a small meal at first, but then you may proceed to your regular diet.  Drink plenty of fluids but you should avoid alcoholic beverages for 24 hours.  ACTIVITY:  You should plan to take it easy for the rest of today and you should NOT DRIVE or use heavy machinery  until tomorrow (because of the sedation medicines used during the test).    FOLLOW UP: Our staff will call the number listed on your records the next business day following your procedure to check on you and address any questions or concerns that you may have regarding the information given to you following your procedure. If we do not reach you, we will leave a message.  However, if you are feeling well and you are not experiencing any problems, there is no need to return our call.  We will assume that you have returned to your regular daily activities without incident.  If any biopsies were taken you will be contacted by phone or by letter within the next 1-3 weeks.  Please call us at [phone] if you have not heard about the biopsies in 3 weeks.    SIGNATURES/CONFIDENTIALITY: You and/or your care partner have signed paperwork which will be entered into your electronic medical record.  These signatures attest to the fact that that the information above on your After Visit Summary has been reviewed and is understood.  Full responsibility of the confidentiality of this discharge information lies with you and/or your care-partner. 

## 2018-11-07 ENCOUNTER — Telehealth: Payer: Self-pay

## 2018-11-07 NOTE — Telephone Encounter (Signed)
  Follow up Call-  Call back number 11/06/2018  Post procedure Call Back phone  # [phone]  Permission to leave phone message Yes  Some recent data might be hidden     No ID on voicemail; no message left

## 2019-02-08 IMAGING — MG DIGITAL SCREENING BILATERAL MAMMOGRAM WITH TOMO AND CAD
6 of 10 series · 6 of 30 positions shown · non-contrast
Comparison: Previous exam(s).

CLINICAL DATA: Screening.

EXAM:
DIGITAL SCREENING BILATERAL MAMMOGRAM WITH TOMO AND CAD

[L MLO synth-2D]
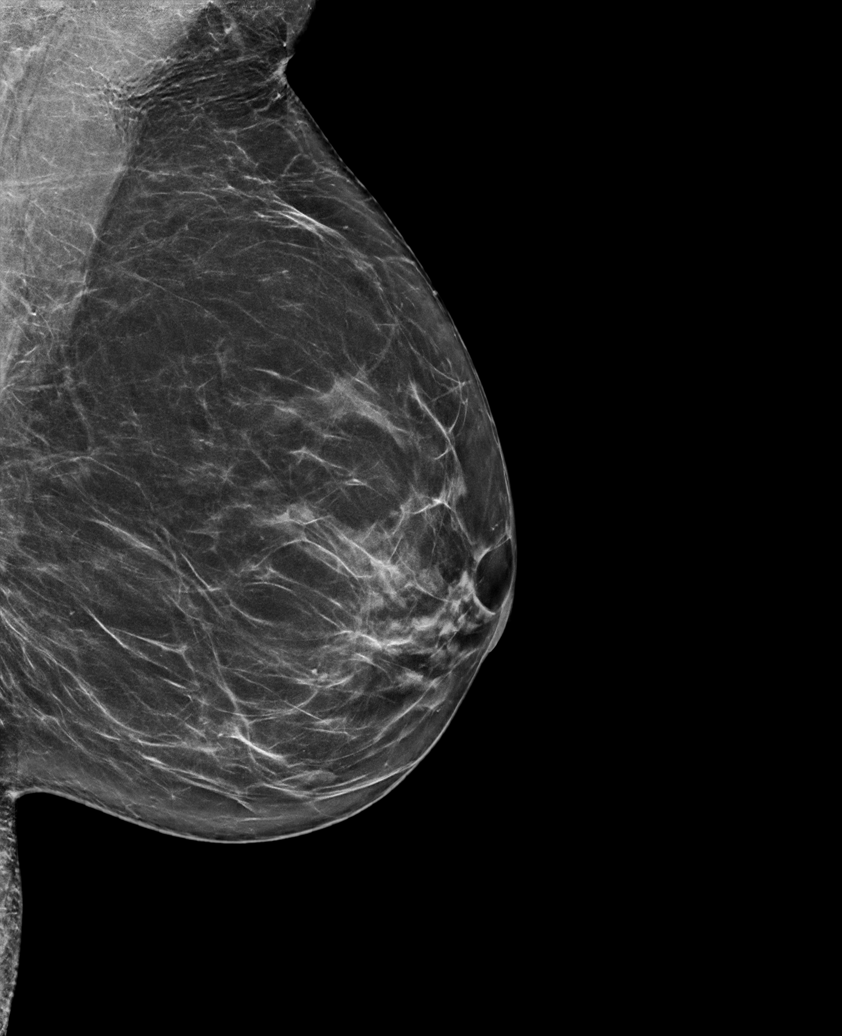

[R CC synth-2D]
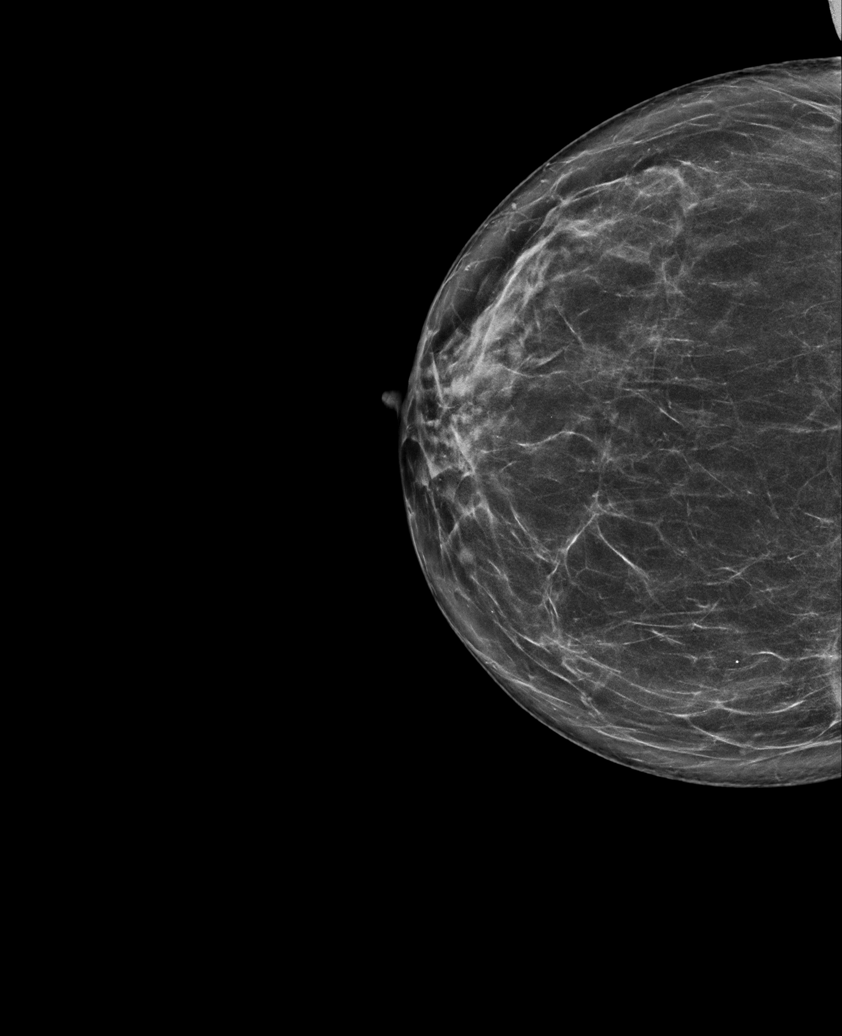

[R MLO synth-2D]
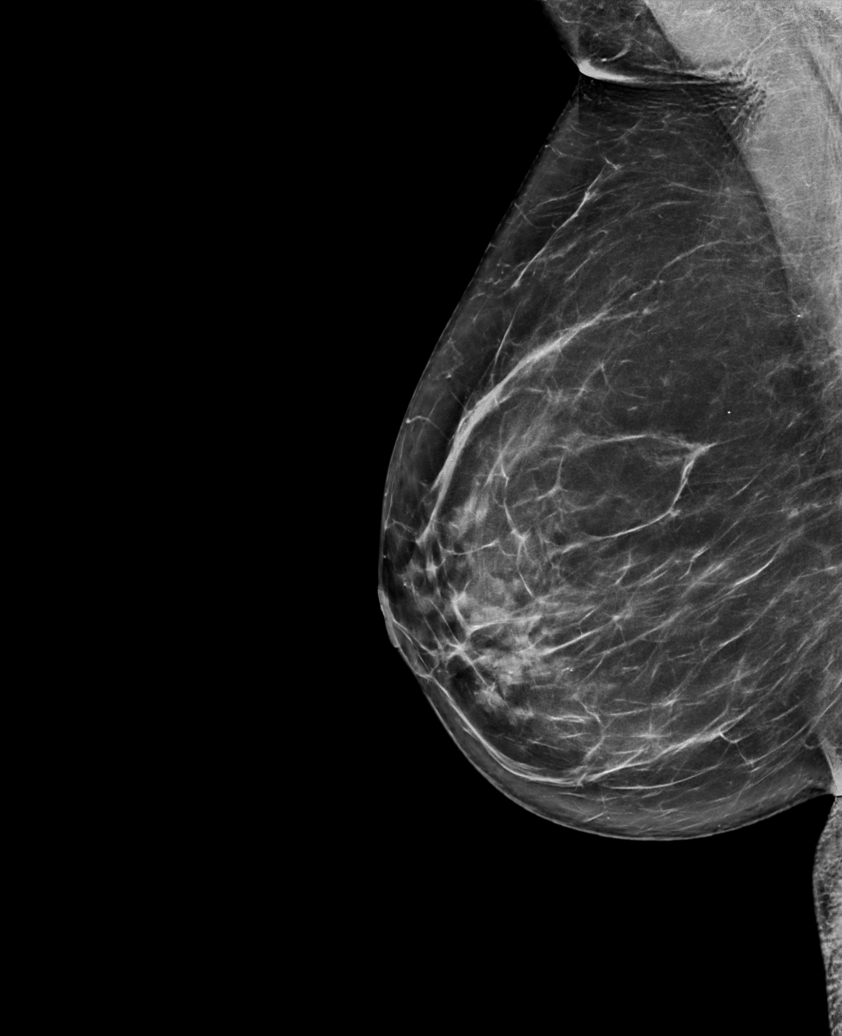

[L CC synth-2D]
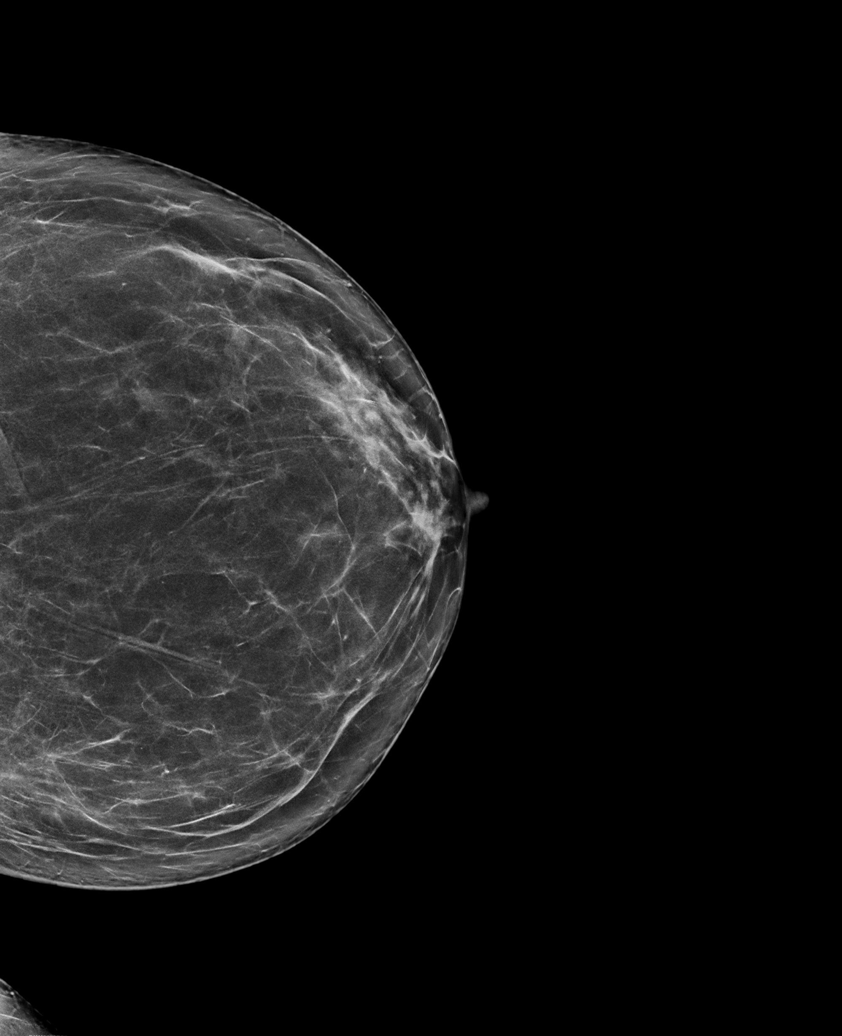

[R XCCL synth-2D]
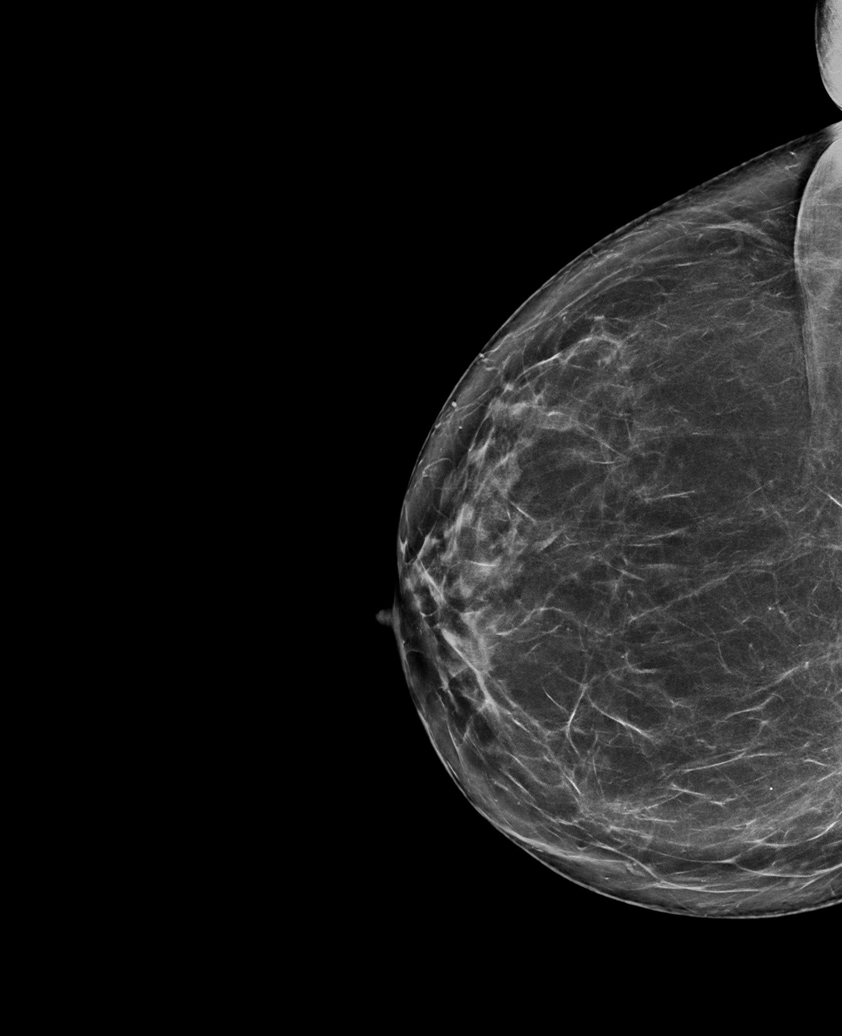

[R CC tomo · tomo slice 38/75.0]
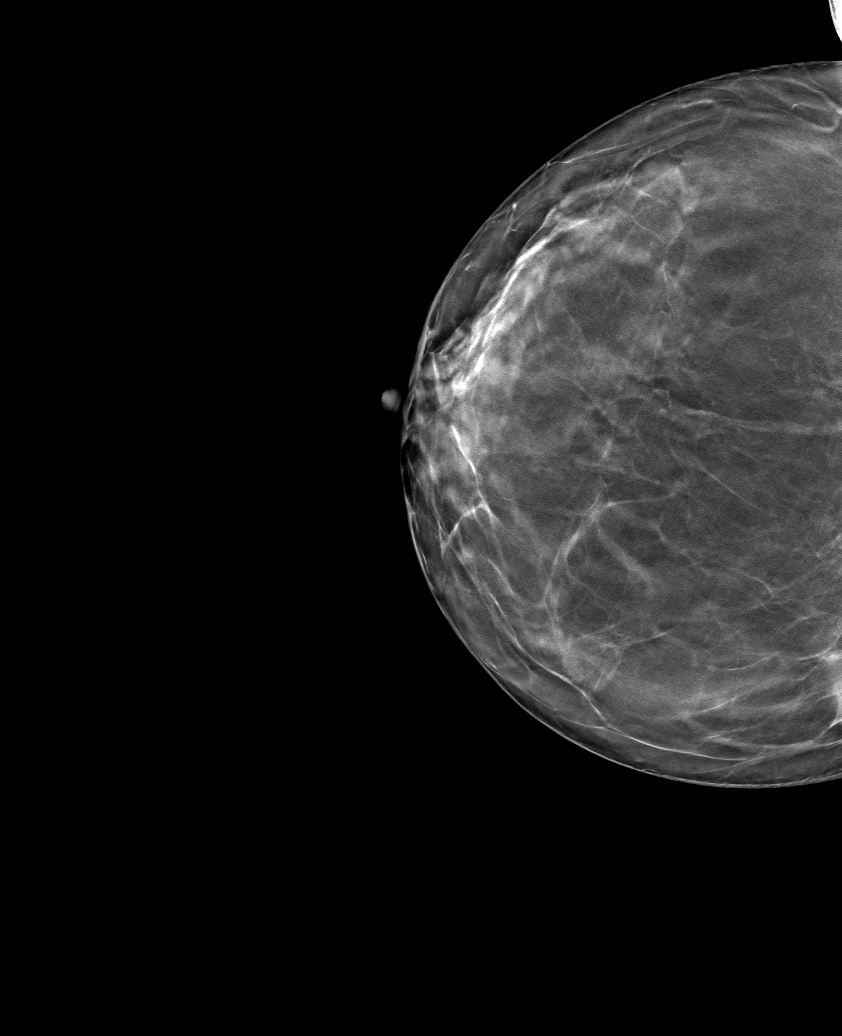

[6 of 30 positions shown; findings below may reference images not displayed]

ACR Breast Density Category b: There are scattered areas of
fibroglandular density.
FINDINGS: There are no findings suspicious for malignancy. Images were
processed with CAD.
IMPRESSION: No mammographic evidence of malignancy. A result letter of this
screening mammogram will be mailed directly to the patient.

RECOMMENDATION:
Screening mammogram in one year. (Code:[id number])

BI-RADS CATEGORY  1: Negative.

## 2019-03-08 ENCOUNTER — Other Ambulatory Visit: Payer: Self-pay

## 2019-03-08 ENCOUNTER — Encounter: Payer: Self-pay | Admitting: *Deleted

## 2019-03-08 ENCOUNTER — Other Ambulatory Visit (INDEPENDENT_AMBULATORY_CARE_PROVIDER_SITE_OTHER): Payer: BLUE CROSS/BLUE SHIELD | Admitting: *Deleted

## 2019-03-08 DIAGNOSIS — R309 Painful micturition, unspecified: Secondary | ICD-10-CM | POA: Diagnosis not present

## 2019-03-08 LAB — POCT URINALYSIS DIPSTICK
Bilirubin, UA: NEGATIVE
Glucose, UA: NEGATIVE
Ketones, UA: NEGATIVE
Nitrite, UA: NEGATIVE
Protein, UA: NEGATIVE
Spec Grav, UA: <=1.005 — AB (ref 1.010–1.025)
Urobilinogen, UA: NEGATIVE E.U./dL — AB
pH, UA: 7 (ref 5.0–8.0)

## 2019-03-08 MED ORDER — NITROFURANTOIN MONOHYD MACRO 100 MG PO CAPS: 100.0000 mg | ORAL_CAPSULE | Freq: Two times a day (BID) | ORAL | 0 refills | Status: AC

## 2019-03-08 MED ORDER — PHENAZOPYRIDINE HCL 200 MG PO TABS: 200.0000 mg | ORAL_TABLET | Freq: Three times a day (TID) | ORAL | 0 refills | Status: AC | PRN

## 2019-03-08 NOTE — Progress Notes (Signed)
SUBJECTIVE: Victoria Hamilton is a 53 y.o. female who complains of urinary frequency, urgency and dysuria x 2 days, without flank pain, fever, chills, or abnormal vaginal discharge or bleeding. She does have a h/o pyelonephritis.  She did do a curbside drop off urine so vitals were not obtained.  She does deny any fever.  OBJECTIVE: Appears well, in no apparent distress.  Vital signs are normal. Urine dipstick shows positive for blood and leukocytes.    ASSESSMENT: Dysuria  PLAN: Treatment per orders of Macrobid and Pyridium.  Will send urine for culture  Call or return to clinic prn if these symptoms worsen or fail to improve as anticipated.

## 2019-03-09 LAB — URINE CULTURE
MICRO NUMBER:: 417144
SPECIMEN QUALITY:: ADEQUATE
